# Patient Record
Sex: Female | Born: 1975 | Race: White | Hispanic: No | Marital: Married | Smoking: Never smoker
Health system: Southern US, Community
[De-identification: ages and names within clinical notes are randomized; demographics above are authoritative.]

## PROBLEM LIST (undated history)

## (undated) DIAGNOSIS — D509 Iron deficiency anemia, unspecified: Secondary | ICD-10-CM

## (undated) DIAGNOSIS — R87629 Unspecified abnormal cytological findings in specimens from vagina: Secondary | ICD-10-CM

## (undated) DIAGNOSIS — O365990 Maternal care for other known or suspected poor fetal growth, unspecified trimester, not applicable or unspecified: Secondary | ICD-10-CM

## (undated) DIAGNOSIS — O99019 Anemia complicating pregnancy, unspecified trimester: Secondary | ICD-10-CM

## (undated) DIAGNOSIS — Z789 Other specified health status: Secondary | ICD-10-CM

## (undated) DIAGNOSIS — D62 Acute posthemorrhagic anemia: Secondary | ICD-10-CM

## (undated) HISTORY — PX: LEEP: SHX91

## (undated) HISTORY — DX: Unspecified abnormal cytological findings in specimens from vagina: R87.629

## (undated) HISTORY — PX: NO PAST SURGERIES: SHX2092

## (undated) HISTORY — DX: Maternal care for other known or suspected poor fetal growth, unspecified trimester, not applicable or unspecified: O36.5990

---

## 1998-12-20 ENCOUNTER — Other Ambulatory Visit: Admission: RE | Admit: 1998-12-20 | Discharge: 1998-12-20 | Payer: Self-pay | Admitting: Obstetrics and Gynecology

## 1999-02-03 ENCOUNTER — Other Ambulatory Visit: Admission: RE | Admit: 1999-02-03 | Discharge: 1999-02-03 | Payer: Self-pay | Admitting: Obstetrics and Gynecology

## 1999-03-06 ENCOUNTER — Other Ambulatory Visit: Admission: RE | Admit: 1999-03-06 | Discharge: 1999-03-06 | Payer: Self-pay | Admitting: Obstetrics and Gynecology

## 1999-07-04 ENCOUNTER — Other Ambulatory Visit: Admission: RE | Admit: 1999-07-04 | Discharge: 1999-07-04 | Payer: Self-pay | Admitting: Obstetrics and Gynecology

## 1999-10-27 ENCOUNTER — Other Ambulatory Visit: Admission: RE | Admit: 1999-10-27 | Discharge: 1999-10-27 | Payer: Self-pay | Admitting: Obstetrics and Gynecology

## 2000-03-09 ENCOUNTER — Other Ambulatory Visit: Admission: RE | Admit: 2000-03-09 | Discharge: 2000-03-09 | Payer: Self-pay | Admitting: Obstetrics and Gynecology

## 2010-05-26 LAB — CBC
HCT: 36 % (ref 36–46)
Hemoglobin: 11.4 g/dL — AB (ref 12.0–16.0)
Platelets: 313 K/µL (ref 150–399)

## 2010-05-26 LAB — HIV ANTIBODY (ROUTINE TESTING W REFLEX): HIV: NONREACTIVE

## 2010-05-26 LAB — ANTIBODY SCREEN: Antibody Screen: NEGATIVE

## 2010-05-26 LAB — RPR: RPR: NONREACTIVE

## 2010-05-26 LAB — RUBELLA ANTIBODY, IGM: Rubella: IMMUNE

## 2010-05-26 LAB — HEPATITIS B SURFACE ANTIGEN: Hepatitis B Surface Ag: NEGATIVE

## 2010-12-31 ENCOUNTER — Inpatient Hospital Stay (HOSPITAL_COMMUNITY)
Admission: AD | Admit: 2010-12-31 | Discharge: 2011-01-02 | DRG: 373 | Disposition: A | Discharge status: Home / Self Care | Payer: BC Managed Care – PPO | Source: Ambulatory Visit | Attending: Obstetrics | Admitting: Obstetrics

## 2010-12-31 ENCOUNTER — Inpatient Hospital Stay (HOSPITAL_COMMUNITY): Payer: BC Managed Care – PPO | Admitting: Anesthesiology

## 2010-12-31 ENCOUNTER — Inpatient Hospital Stay (HOSPITAL_COMMUNITY)
Admission: AD | Admit: 2010-12-31 | Discharge: 2010-12-31 | Disposition: A | Discharge status: Home / Self Care | Payer: Self-pay | Attending: Obstetrics | Admitting: Obstetrics

## 2010-12-31 ENCOUNTER — Inpatient Hospital Stay (HOSPITAL_COMMUNITY): Admission: AD | Admit: 2010-12-31 | Payer: Self-pay | Source: Home / Self Care | Admitting: Obstetrics

## 2010-12-31 ENCOUNTER — Encounter (HOSPITAL_COMMUNITY): Payer: Self-pay

## 2010-12-31 ENCOUNTER — Encounter (HOSPITAL_COMMUNITY): Payer: Self-pay | Admitting: Anesthesiology

## 2010-12-31 DIAGNOSIS — D509 Iron deficiency anemia, unspecified: Secondary | ICD-10-CM | POA: Diagnosis present

## 2010-12-31 DIAGNOSIS — O09529 Supervision of elderly multigravida, unspecified trimester: Secondary | ICD-10-CM | POA: Diagnosis present

## 2010-12-31 DIAGNOSIS — O9903 Anemia complicating the puerperium: Secondary | ICD-10-CM | POA: Diagnosis not present

## 2010-12-31 DIAGNOSIS — IMO0002 Reserved for concepts with insufficient information to code with codable children: Secondary | ICD-10-CM | POA: Diagnosis present

## 2010-12-31 DIAGNOSIS — O365990 Maternal care for other known or suspected poor fetal growth, unspecified trimester, not applicable or unspecified: Principal | ICD-10-CM | POA: Diagnosis present

## 2010-12-31 DIAGNOSIS — D62 Acute posthemorrhagic anemia: Secondary | ICD-10-CM | POA: Diagnosis not present

## 2010-12-31 HISTORY — DX: Acute posthemorrhagic anemia: D62

## 2010-12-31 HISTORY — DX: Iron deficiency anemia, unspecified: D50.9

## 2010-12-31 HISTORY — DX: Anemia complicating pregnancy, unspecified trimester: O99.019

## 2010-12-31 HISTORY — DX: Other specified health status: Z78.9

## 2010-12-31 LAB — CBC
HCT: 33.6 % — ABNORMAL LOW (ref 36.0–46.0)
MCHC: 32.4 g/dL (ref 30.0–36.0)
Platelets: 250 10*3/uL (ref 150–400)
RDW: 15.8 % — ABNORMAL HIGH (ref 11.5–15.5)
WBC: 7.7 10*3/uL (ref 4.0–10.5)

## 2010-12-31 LAB — RPR: RPR Ser Ql: NONREACTIVE

## 2010-12-31 MED ORDER — LACTATED RINGERS IV SOLN: 500.0000 mL | INTRAVENOUS | Status: DC | PRN

## 2010-12-31 MED ORDER — EPHEDRINE 5 MG/ML INJ
10.0000 mg | INTRAVENOUS | Status: DC | PRN
  Filled 2010-12-31: qty 4

## 2010-12-31 MED ORDER — TETANUS-DIPHTH-ACELL PERTUSSIS 5-2.5-18.5 LF-MCG/0.5 IM SUSP
0.5000 mL | Freq: Once | INTRAMUSCULAR | Status: AC
  Administered 2011-01-01: 0.5 mL via INTRAMUSCULAR
  Filled 2010-12-31: qty 0.5

## 2010-12-31 MED ORDER — ONDANSETRON HCL 4 MG/2ML IJ SOLN: 4.0000 mg | Freq: Four times a day (QID) | INTRAMUSCULAR | Status: DC | PRN

## 2010-12-31 MED ORDER — IBUPROFEN 600 MG PO TABS
600.0000 mg | ORAL_TABLET | Freq: Four times a day (QID) | ORAL | Status: DC
  Administered 2011-01-01 – 2011-01-02 (×6): 600 mg via ORAL
  Filled 2010-12-31 (×6): qty 1

## 2010-12-31 MED ORDER — SIMETHICONE 80 MG PO CHEW: 80.0000 mg | CHEWABLE_TABLET | ORAL | Status: DC | PRN

## 2010-12-31 MED ORDER — OXYCODONE-ACETAMINOPHEN 5-325 MG PO TABS: 1.0000 | ORAL_TABLET | ORAL | Status: DC | PRN

## 2010-12-31 MED ORDER — OXYTOCIN 20 UNITS IN LACTATED RINGERS INFUSION - SIMPLE
1.0000 m[IU]/min | INTRAVENOUS | Status: DC
Start: 2010-12-31 — End: 2010-12-31
  Administered 2010-12-31: 2 m[IU]/min via INTRAVENOUS

## 2010-12-31 MED ORDER — FLEET ENEMA 7-19 GM/118ML RE ENEM: 1.0000 | ENEMA | RECTAL | Status: DC | PRN

## 2010-12-31 MED ORDER — IBUPROFEN 600 MG PO TABS: 600.0000 mg | ORAL_TABLET | Freq: Four times a day (QID) | ORAL | Status: DC | PRN

## 2010-12-31 MED ORDER — CITRIC ACID-SODIUM CITRATE 334-500 MG/5ML PO SOLN: 30.0000 mL | ORAL | Status: DC | PRN

## 2010-12-31 MED ORDER — ZOLPIDEM TARTRATE 5 MG PO TABS: 5.0000 mg | ORAL_TABLET | Freq: Every evening | ORAL | Status: DC | PRN

## 2010-12-31 MED ORDER — ACETAMINOPHEN 325 MG PO TABS: 650.0000 mg | ORAL_TABLET | ORAL | Status: DC | PRN

## 2010-12-31 MED ORDER — WITCH HAZEL-GLYCERIN EX PADS: 1.0000 "application " | MEDICATED_PAD | CUTANEOUS | Status: DC | PRN

## 2010-12-31 MED ORDER — LANOLIN HYDROUS EX OINT: TOPICAL_OINTMENT | CUTANEOUS | Status: DC | PRN

## 2010-12-31 MED ORDER — LIDOCAINE HCL 1.5 % IJ SOLN
INTRAMUSCULAR | Status: DC | PRN
  Administered 2010-12-31 (×2): 5 mL via EPIDURAL

## 2010-12-31 MED ORDER — PHENYLEPHRINE 40 MCG/ML (10ML) SYRINGE FOR IV PUSH (FOR BLOOD PRESSURE SUPPORT)
80.0000 ug | PREFILLED_SYRINGE | INTRAVENOUS | Status: DC | PRN
  Filled 2010-12-31: qty 5

## 2010-12-31 MED ORDER — OXYCODONE-ACETAMINOPHEN 5-325 MG PO TABS: 2.0000 | ORAL_TABLET | ORAL | Status: DC | PRN

## 2010-12-31 MED ORDER — PRENATAL PLUS 27-1 MG PO TABS
1.0000 | ORAL_TABLET | Freq: Every day | ORAL | Status: DC
  Administered 2011-01-01 – 2011-01-02 (×2): 1 via ORAL
  Filled 2010-12-31 (×2): qty 1

## 2010-12-31 MED ORDER — ONDANSETRON HCL 4 MG PO TABS: 4.0000 mg | ORAL_TABLET | ORAL | Status: DC | PRN

## 2010-12-31 MED ORDER — LACTATED RINGERS IV SOLN
INTRAVENOUS | Status: DC
  Administered 2010-12-31: 125 mL/h via INTRAVENOUS
  Administered 2010-12-31: 18:00:00 via INTRAVENOUS

## 2010-12-31 MED ORDER — ONDANSETRON HCL 4 MG/2ML IJ SOLN: 4.0000 mg | INTRAMUSCULAR | Status: DC | PRN

## 2010-12-31 MED ORDER — EPHEDRINE 5 MG/ML INJ
10.0000 mg | INTRAVENOUS | Status: DC | PRN
  Filled 2010-12-31 (×2): qty 4

## 2010-12-31 MED ORDER — DIBUCAINE 1 % RE OINT: 1.0000 "application " | TOPICAL_OINTMENT | RECTAL | Status: DC | PRN

## 2010-12-31 MED ORDER — SENNOSIDES-DOCUSATE SODIUM 8.6-50 MG PO TABS
2.0000 | ORAL_TABLET | Freq: Every day | ORAL | Status: DC
  Administered 2011-01-01: 2 via ORAL

## 2010-12-31 MED ORDER — FENTANYL 2.5 MCG/ML BUPIVACAINE 1/10 % EPIDURAL INFUSION (WH - ANES)
14.0000 mL/h | INTRAMUSCULAR | Status: DC
  Administered 2010-12-31 (×2): 14 mL/h via EPIDURAL
  Filled 2010-12-31 (×2): qty 60

## 2010-12-31 MED ORDER — DIPHENHYDRAMINE HCL 25 MG PO CAPS: 25.0000 mg | ORAL_CAPSULE | Freq: Four times a day (QID) | ORAL | Status: DC | PRN

## 2010-12-31 MED ORDER — PHENYLEPHRINE 40 MCG/ML (10ML) SYRINGE FOR IV PUSH (FOR BLOOD PRESSURE SUPPORT)
80.0000 ug | PREFILLED_SYRINGE | INTRAVENOUS | Status: DC | PRN
  Filled 2010-12-31 (×2): qty 5

## 2010-12-31 MED ORDER — LACTATED RINGERS IV SOLN
500.0000 mL | Freq: Once | INTRAVENOUS | Status: AC
  Administered 2010-12-31: 1000 mL via INTRAVENOUS

## 2010-12-31 MED ORDER — LIDOCAINE HCL (PF) 1 % IJ SOLN
30.0000 mL | INTRAMUSCULAR | Status: DC | PRN
  Filled 2010-12-31 (×2): qty 30

## 2010-12-31 MED ORDER — OXYTOCIN BOLUS FROM INFUSION
500.0000 mL | Freq: Once | INTRAVENOUS | Status: AC
  Administered 2010-12-31: 500 mL via INTRAVENOUS
  Filled 2010-12-31: qty 1000
  Filled 2010-12-31: qty 500

## 2010-12-31 MED ORDER — DIPHENHYDRAMINE HCL 50 MG/ML IJ SOLN: 12.5000 mg | INTRAMUSCULAR | Status: DC | PRN

## 2010-12-31 MED ORDER — BENZOCAINE-MENTHOL 20-0.5 % EX AERO: 1.0000 "application " | INHALATION_SPRAY | CUTANEOUS | Status: DC | PRN

## 2010-12-31 NOTE — Progress Notes (Signed)
Delivery Note At 7.49 pm a viable female was delivered via  (Presentation: Vertex, OA, ;  ).  APGAR: 7/9 , ; weight .  6 lb 1 oz. Placenta status: complete and intact, spontaneous .  Cord: 3 vessel./  Anesthesia:  Epidural Episiotomy:  none Lacerations: small 1st degree vaginal Suture Repair: 2.0 vicryl Est. Blood Loss (mL): 200 cc  Mom to postpartum.  Baby to nursery-stable. Plans to breast and bottle feed.   Kingstyn Deruiter R 12/31/2010, 8:14 PM

## 2010-12-31 NOTE — ED Notes (Signed)
L&D, Hubert Azure, RN called back for Dr. Seymour Bars. Pt. to stay and await room assignment.

## 2010-12-31 NOTE — Progress Notes (Signed)
  Subjective: Doing well, pain well controlled with Epidural, no pelvic pressure  Objective: BP 113/73  Pulse 92  Temp(Src) 98.4 F (36.9 C) (Oral)  Resp 20  Ht 5\' 1"  (1.549 m)  Wt 55.611 kg (122 lb 9.6 oz)  BMI 23.17 kg/m2  SpO2 100%  FHT:  FHR: 140s bpm, variability: moderate,  accelerations:  Present,  decelerations:  Absent UC:   irregular, every 3-6 minutes (pitocin at 4 mu rate) SVE:   Dilation: 6 Effacement (%): 100 Station: -2 Exam by:: dr. Cassadi Purdie AROM clear fluid.   Assessment / Plan: Augmentation of labor, progressing well  Fetal Wellbeing:  Category I Pain Control:  Epidural  Anticipated MOD:  NSVD  Tracy Crosby R 12/31/2010, 5:28 PM

## 2010-12-31 NOTE — Progress Notes (Signed)
Pt states she was in the office on 8-1 and was 5 cm. Was told to come to [facility] today for direct admit for induction of labor. Pt states irregular contractions, good fetal movement, no bleeding or leaking.

## 2010-12-31 NOTE — Anesthesia Procedure Notes (Signed)

## 2010-12-31 NOTE — ED Notes (Signed)
Charge nurse on BS to call back with estimated time for patient to go to BS. If it is going to be a while yet, Dr. Seymour Bars states pt. may go home and be called when room available. Will await CN call.

## 2010-12-31 NOTE — Anesthesia Preprocedure Evaluation (Signed)
Anesthesia Evaluation  Name, MR# and DOB Patient awake  General Assessment Comment  Reviewed: Allergy & Precautions, H&P , Patient's Chart, lab work & pertinent test results  Airway Mallampati: II TM Distance: >3 FB Neck ROM: full    Dental No notable dental hx.    Pulmonary  clear to auscultation  pulmonary exam normalPulmonary Exam Normal breath sounds clear to auscultation none    Cardiovascular regular Normal    Neuro/Psych Negative Neurological ROS  Negative Psych ROS  GI/Hepatic/Renal negative GI ROS, negative Liver ROS, and negative Renal ROS (+)       Endo/Other  Negative Endocrine ROS (+)      Abdominal   Musculoskeletal   Hematology negative hematology ROS (+)   Peds  Reproductive/Obstetrics (+) Pregnancy    Anesthesia Other Findings             Anesthesia Physical Anesthesia Plan  ASA: II  Anesthesia Plan: Epidural   Post-op Pain Management:    Induction:   Airway Management Planned:   Additional Equipment:   Intra-op Plan:   Post-operative Plan:   Informed Consent: I have reviewed the patients History and Physical, chart, labs and discussed the procedure including the risks, benefits and alternatives for the proposed anesthesia with the patient or authorized representative who has indicated his/her understanding and acceptance.     Plan Discussed with:   Anesthesia Plan Comments:         Anesthesia Quick Evaluation  

## 2010-12-31 NOTE — H&P (Signed)
  Tracy Crosby is a 34 y.o. female presenting for IOL at 40 wks for IUGR, sono on 8/13 noted growth at 6% EFW 6'1" . Desires epidural, cx is 5 cm dilated per Dr Ernestina Penna in office on 8/13.  [facility] since 8 wks   OB History    Grav Para Term Preterm Abortions TAB SAB Ect Mult Living   4 2 2  0 1 0 1 0 0 2     Past Medical History  Diagnosis Date  . No pertinent past medical history    Past Surgical History  Procedure Date  . No past surgeries    Family History: family history includes Asthma in her brother and Birth defects in her brother. Social History:  reports that she has never smoked. She has never used smokeless tobacco. She reports that she does not drink alcohol or use illicit drugs.  PHYSICAL EXAM:   Blood pressure 102/61, pulse 83, temperature 98.4 F (36.9 C), temperature source Oral, resp. rate 20, height 5\' 1"  (1.549 m), weight 55.611 kg (122 lb 9.6 oz), SpO2 99.00%.  A&O x 3, no acute distress. Pleasant HEENT neg, no thyromegaly Lungs CTA bilat CV RRR, A1S2 normal Abdo soft, non tender, non acute Extr no edema/ tenderness Pelvic 5/80%/-1/ intact/ vtx FHT 130/ + accels/ no decels/ mod variability/ Cat.I Toco irreg.   Prenatal labs: ABO, Rh: A/Positive/-- (01/09 0000) Antibody: Negative (01/09 0000) Rubella:  Immune RPR: Nonreactive (01/09 0000)  HBsAg: Negative (01/09 0000)  HIV: Non-reactive (01/09 0000)  GBS: Negative (07/17 0000)  1 hr Glucola  130 Genetic screening  Quad screen negative Anatomy US Normal (partial previa resolved), recent sono - IUGR (on 12/29/10), EFW at 6%.   Assessment/Plan: Admit for AOL at 40 wks for IUGR and advanced cervical dilatation/prodromal labor Low dose pitocin, epidural, AROM and anticipate SVD.   Tracy Crosby R 12/31/2010, 2:12 PM

## 2010-12-31 NOTE — Progress Notes (Signed)
Pt. States was told fetal growth is 10th%ile, the reason for having pt. come in today for induction.

## 2010-12-31 NOTE — Progress Notes (Signed)
  Subjective: Doing well, pain well controlled with  Epidural, no pelvic pressure.  Objective: BP 117/73  Pulse 91  Temp(Src) 98.3 F (36.8 C) (Oral)  Resp 20  Ht 5\' 1"  (1.549 m)  Wt 55.611 kg (122 lb 9.6 oz)  BMI 23.17 kg/m2  SpO2 100%  FHT:  FHR: 130s bpm, variability: moderate,  accelerations:  Present,  decelerations:  Absent UC:   regular, every 2 minutes (on pitocin at 4 mu rate, will reduce it to 2 mu rate). SVE:  Complete/ 100%/+1.   Assessment / Plan: Augmentation of labor, progressing well.   Fetal Wellbeing:  Category I Pain Control:  Epidural  Anticipated MOD:  NSVD. Will begin bearing down once her husband returns to room since [employer] [employer] [facility] [employer] category I and mom is comfortable.   Rmoni Keplinger R 12/31/2010, 7:03 PM

## 2011-01-01 ENCOUNTER — Encounter (HOSPITAL_COMMUNITY): Payer: Self-pay

## 2011-01-01 ENCOUNTER — Other Ambulatory Visit: Payer: Self-pay | Admitting: Obstetrics & Gynecology

## 2011-01-01 DIAGNOSIS — D509 Iron deficiency anemia, unspecified: Secondary | ICD-10-CM | POA: Diagnosis present

## 2011-01-01 DIAGNOSIS — D62 Acute posthemorrhagic anemia: Secondary | ICD-10-CM | POA: Diagnosis not present

## 2011-01-01 HISTORY — DX: Iron deficiency anemia, unspecified: D50.9

## 2011-01-01 HISTORY — DX: Acute posthemorrhagic anemia: D62

## 2011-01-01 LAB — CBC
HCT: 28.3 % — ABNORMAL LOW (ref 36.0–46.0)
Hemoglobin: 9.2 g/dL — ABNORMAL LOW (ref 12.0–15.0)
MCHC: 32.5 g/dL (ref 30.0–36.0)
MCV: 84.2 fL (ref 78.0–100.0)
RDW: 15.8 % — ABNORMAL HIGH (ref 11.5–15.5)

## 2011-01-01 MED ORDER — POLYSACCHARIDE IRON 150 MG PO CAPS
150.0000 mg | ORAL_CAPSULE | Freq: Every day | ORAL | Status: DC
  Administered 2011-01-01: 150 mg via ORAL
  Filled 2011-01-01 (×2): qty 1

## 2011-01-01 MED ORDER — DOCUSATE SODIUM 100 MG PO CAPS
100.0000 mg | ORAL_CAPSULE | Freq: Every day | ORAL | Status: DC
  Administered 2011-01-01: 100 mg via ORAL
  Filled 2011-01-01: qty 1

## 2011-01-01 NOTE — Progress Notes (Signed)
  PPD 1 SVD  S:  Reports feeling well but tired, baby up feeding through the night             Tolerating po/ No nausea or vomiting             Bleeding is moderate             Pain controlled withprescription NSAID's including ibuprofen (Motrin)             Up ad lib / ambulatory  Newborn breast feeding    O:  A & O x 3 NAD             VS: Blood pressure 119/82, pulse 103, temperature 98.3 F (36.8 C), temperature source Oral, resp. rate 20, height 5\' 1"  (1.549 m), weight 55.611 kg (122 lb 9.6 oz), SpO2 96.00%,    LABS:  Basename 01/01/11 0510 12/31/10 1400  HGB 9.2* 10.9*  HCT 28.3* 33.6*    I&O: I/O last 3 completed shifts: In: -  Out: 350 [Urine:150; Blood:200]      Lungs: Clear and unlabored  Heart: regular rate and rhythm / no mumurs  Abdomen: soft, non-tender, non-distended              Fundus: firm, non-tender, U@  Perineum: intact repair, no edema  Lochia: small  Extremities: no edema, no calf pain or tenderness, neg Homans    A/P: PPD # 1   Doing well - stable status  Chronic IDA w/ superimposed ABL - will start oral Fe and stool softeners  Routine post partum orders  Anticipate d/c in am  PAUL,DANIELA 01/01/2011, 12:44 PM

## 2011-01-02 MED ORDER — DSS 100 MG PO CAPS: 100.0000 mg | ORAL_CAPSULE | Freq: Every day | ORAL | Status: AC

## 2011-01-02 NOTE — Anesthesia Postprocedure Evaluation (Signed)
  Anesthesia Post-op Note  Patient: Tracy Crosby  Procedure(s) Performed: * No procedures listed *  Patient Location: PACU and Mother/Baby  Anesthesia Type: Epidural  Level of Consciousness: awake, alert  and oriented  Airway and Oxygen Therapy: Patient Spontanous Breathing  Post-op Pain: mild  Post-op Assessment: Patient's Cardiovascular Status Stable  Post-op Vital Signs: stable  Complications: No apparent anesthesia complications

## 2011-01-02 NOTE — Discharge Summary (Signed)
Obstetric Discharge Summary Reason for Admission: onset of labor/IUGR Prenatal Procedures: ultrasound Intrapartum Procedures: spontaneous vaginal delivery Postpartum Procedures: none Complications-Operative and Postpartum: Small 1st degree perineal laceration Hemoglobin  Date Value Range Status  01/01/2011 9.2* 12.0-15.0 (g/dL) Final     HCT  Date Value Range Status  01/01/2011 28.3* 36.0-46.0 (%) Final    Discharge Diagnoses: Term Pregnancy-delivered and ABL Anemia  Discharge Information: Date: 01/02/2011 Activity: pelvic rest Diet: routine Medications: Colace and Iron Condition: stable Instructions: refer to practice specific booklet Discharge to: home   Newborn Data: Live born female  Birth Weight: 6 lb 1.5 oz (2765 g) APGAR: 7, 9  Home with mother.  Libby Goehring K 01/02/2011, 10:11 AM

## 2011-01-02 NOTE — Progress Notes (Signed)
  PPD # 2  Subjective: Pt reports feeling well and eager for d/c home/ Pain controlled with ibuprofen (OTC) Tolerating po/ Voiding without problems/ No n/v Bleeding is light/ Newborn info:  Information for the patient's newborn:  Bame, Girl Der [[id number]]  female Feeding: breast    Objective:  VS: Blood pressure 101/67, pulse 69, temperature 97.9 F (36.6 C), temperature source Oral, resp. rate 18  Basename 01/01/11 0510 12/31/10 1400  WBC 9.3 7.7  HGB 9.2* 10.9*  HCT 28.3* 33.6*  PLT 209 250    Blood type: A/Positive/-- (01/09 0000) Rubella: Immune (01/09 0000)    Physical Exam:  General: alert, cooperative and no distress Abdomen: soft, nontender, normal bowel sounds Uterine Fundus: firm, below umbilicus, nontender Perineum: not inspected Lochia: minimal Ext: Homans sign is negative, no sign of DVT and no edema, redness or tenderness in the calves or thighs   A/P: PPD # 2/ [id number] Chronic Anemia, superimposed with ABL Anemia Doing well and stable for discharge home RX: niferex 156m po QD #30 Ref x 1 Colace 100mg  po 1 to 2 times per day prn #30 Ref x 1 WOB/GYN booklet given Routine pp visit in 6wks

## 2011-01-03 NOTE — Discharge Summary (Signed)
Agree with note and plan.

## 2014-03-19 ENCOUNTER — Encounter (HOSPITAL_COMMUNITY): Payer: Self-pay

## 2016-01-17 DIAGNOSIS — Z3A01 Less than 8 weeks gestation of pregnancy: Secondary | ICD-10-CM | POA: Diagnosis not present

## 2016-01-17 DIAGNOSIS — O2620 Pregnancy care for patient with recurrent pregnancy loss, unspecified trimester: Secondary | ICD-10-CM | POA: Diagnosis not present

## 2016-01-17 DIAGNOSIS — Z3201 Encounter for pregnancy test, result positive: Secondary | ICD-10-CM | POA: Diagnosis not present

## 2016-01-29 DIAGNOSIS — Z3201 Encounter for pregnancy test, result positive: Secondary | ICD-10-CM | POA: Diagnosis not present

## 2016-02-17 ENCOUNTER — Other Ambulatory Visit: Payer: Self-pay

## 2016-02-17 DIAGNOSIS — Z3A11 11 weeks gestation of pregnancy: Secondary | ICD-10-CM | POA: Diagnosis not present

## 2016-02-17 DIAGNOSIS — O09521 Supervision of elderly multigravida, first trimester: Secondary | ICD-10-CM | POA: Diagnosis not present

## 2016-02-17 DIAGNOSIS — Z1329 Encounter for screening for other suspected endocrine disorder: Secondary | ICD-10-CM | POA: Diagnosis not present

## 2016-02-17 LAB — OB RESULTS CONSOLE ANTIBODY SCREEN: Antibody Screen: NEGATIVE

## 2016-02-17 LAB — OB RESULTS CONSOLE HEPATITIS B SURFACE ANTIGEN: HEP B S AG: NEGATIVE

## 2016-02-17 LAB — OB RESULTS CONSOLE ABO/RH: RH TYPE: POSITIVE

## 2016-02-17 LAB — OB RESULTS CONSOLE HIV ANTIBODY (ROUTINE TESTING): HIV: NONREACTIVE

## 2016-02-17 LAB — OB RESULTS CONSOLE RUBELLA ANTIBODY, IGM: RUBELLA: IMMUNE

## 2016-02-17 LAB — OB RESULTS CONSOLE RPR: RPR: NONREACTIVE

## 2016-03-09 DIAGNOSIS — Z23 Encounter for immunization: Secondary | ICD-10-CM | POA: Diagnosis not present

## 2016-03-25 DIAGNOSIS — O2620 Pregnancy care for patient with recurrent pregnancy loss, unspecified trimester: Secondary | ICD-10-CM | POA: Diagnosis not present

## 2016-03-25 DIAGNOSIS — O09522 Supervision of elderly multigravida, second trimester: Secondary | ICD-10-CM | POA: Diagnosis not present

## 2016-03-25 DIAGNOSIS — Z3A16 16 weeks gestation of pregnancy: Secondary | ICD-10-CM | POA: Diagnosis not present

## 2016-03-25 DIAGNOSIS — Z361 Encounter for antenatal screening for raised alphafetoprotein level: Secondary | ICD-10-CM | POA: Diagnosis not present

## 2016-03-25 DIAGNOSIS — O3442 Maternal care for other abnormalities of cervix, second trimester: Secondary | ICD-10-CM | POA: Diagnosis not present

## 2016-04-01 DIAGNOSIS — Z361 Encounter for antenatal screening for raised alphafetoprotein level: Secondary | ICD-10-CM | POA: Diagnosis not present

## 2016-04-07 ENCOUNTER — Other Ambulatory Visit (HOSPITAL_COMMUNITY): Payer: Self-pay | Admitting: Obstetrics

## 2016-04-07 DIAGNOSIS — Z3A19 19 weeks gestation of pregnancy: Secondary | ICD-10-CM

## 2016-04-07 DIAGNOSIS — O280 Abnormal hematological finding on antenatal screening of mother: Secondary | ICD-10-CM

## 2016-04-07 DIAGNOSIS — Z3689 Encounter for other specified antenatal screening: Secondary | ICD-10-CM

## 2016-04-07 DIAGNOSIS — O09522 Supervision of elderly multigravida, second trimester: Secondary | ICD-10-CM

## 2016-04-15 ENCOUNTER — Encounter (HOSPITAL_COMMUNITY): Payer: Self-pay | Admitting: *Deleted

## 2016-04-16 ENCOUNTER — Ambulatory Visit (HOSPITAL_COMMUNITY)
Admission: RE | Admit: 2016-04-16 | Discharge: 2016-04-16 | Disposition: A | Discharge status: Home / Self Care | Payer: BLUE CROSS/BLUE SHIELD | Source: Ambulatory Visit | Attending: Obstetrics | Admitting: Obstetrics

## 2016-04-16 ENCOUNTER — Encounter (HOSPITAL_COMMUNITY): Payer: Self-pay

## 2016-04-16 DIAGNOSIS — O289 Unspecified abnormal findings on antenatal screening of mother: Secondary | ICD-10-CM | POA: Insufficient documentation

## 2016-04-16 DIAGNOSIS — Z3A19 19 weeks gestation of pregnancy: Secondary | ICD-10-CM

## 2016-04-16 DIAGNOSIS — O09522 Supervision of elderly multigravida, second trimester: Secondary | ICD-10-CM | POA: Diagnosis not present

## 2016-04-16 DIAGNOSIS — O280 Abnormal hematological finding on antenatal screening of mother: Secondary | ICD-10-CM

## 2016-04-16 DIAGNOSIS — Z3689 Encounter for other specified antenatal screening: Secondary | ICD-10-CM | POA: Diagnosis not present

## 2016-04-16 DIAGNOSIS — Z363 Encounter for antenatal screening for malformations: Secondary | ICD-10-CM | POA: Diagnosis not present

## 2016-04-16 NOTE — Progress Notes (Signed)
Genetic Counseling  High-Risk Gestation Note  Appointment Date:  04/16/2016 Referred By: Aloha Gell, MD Date of Birth:  Sep 14, 1975   Pregnancy History: [id number] Estimated Date of Delivery: 09/07/16 Estimated Gestational Age: 60w3dAttending:  PBenjaman Lobe MD   [name] was seen for consultation for genetic counseling because of an elevated MSAFP of 3.35 MoMs based on maternal serum screening. The patient is 40y.o..    In summary:  Discussed elevated MSAFP and associated 1 in [id number]  Reviewed possible explanations for elevation  Discussed additional options  Ultrasound- performed today, within normal range  Amniocentesis-declined  Discussed associations with unexplained elevated MSAFP- follow-up ultrasound available in third trimester to assess fetal growth  Reviewed AMA and associated risk for fetal aneuploidy  Reviewed results of previous screening  NIPS (Panorama) performed through OB office- within normal range  Reviewed family history concerns  Discussed carrier screening options  CF- previously performed and screen negative per patient  SMA- declined  Hemoglobinopathies- declined  We reviewed [name]'s maternal serum screening result, the elevation of MSAFP, and the associated 1 in 65 risk for a fetal open neural tube defect.   We reviewed open neural tube defects including: the typical multifactorial etiology and variable prognosis.  In addition, we discussed alternative explanations for an elevated MSAFP including: normal variation, twins, feto-maternal bleeding, a gestational dating error, abdominal wall defects, kidney differences, oligohydramnios, and placental problems.  We discussed that an unexplained elevation of MSAFP is associated with an increased risk for third trimester complications including: prematurity, low birth weight, and pre-eclampsia.    We reviewed additional available screening and diagnostic options including  detailed ultrasound and amniocentesis.  We discussed the risks, limitations, and benefits of each. A complete ultrasound was performed today, and visualized fetal anatomy was within normal range. Completed ultrasound results reported under separate cover.  After thoughtful consideration of these options, [name] declined amniocentesis. She understands that ultrasound cannot rule out all birth defects or genetic syndromes.  However, she was counseled that 80-90% of fetuses with open neural tube defects can be detected by detailed second trimester ultrasound, when well visualized. Given the elevated MSAFP a third trimester growth scan is available to the patient to assess fetal growth.    We also reviewed the association with fetal aneuploidy related to increased maternal age. We reviewed chromosomes, nondisjunction, and examples of chromosome conditions. We also reviewed [name]'s noninvasive prenatal screening (NIPS)/cell free DNA (cfDNA) testing result. Specifically, Mrs. RBERGEVINhad Panorama testing performed at her obstetrician's office. We reviewed that the results from this testing are within normal range, showing less than 1 in 10,000 risk for trisomy 21, 18, 13, and monosomy X. We reviewed that NIPS analyzes specific placental (fetal) DNA in maternal circulation. NIPS is considered to be highly specific and sensitive, but is not considered to be a diagnostic test. We reviewed that this testing identifies the majority of pregnancies with trisomies 21, 12 and 18, as well as specific sex chromosome conditions including: 47,XXX and 47,XXY.     We reviewed other available options including detailed ultrasound and amniocentesis. We reviewed the approximate 1 in 3063-016risk for complications for amniocentesis, including spontaneous pregnancy loss. Ms. RBALBIunderstands that given the normal NIPS (Panorama) result, the risk of fetal aneuploidy is much lower than the risk of a complication related to  diagnostic invasive testing. After consideration of these options, she declined amniocentesis.  Mrs. RGAINERwas provided with written information regarding cystic fibrosis (  CF), spinal muscular atrophy (SMA) and hemoglobinopathies including the carrier frequency, availability of carrier screening and prenatal diagnosis if indicated.  In addition, we discussed that CF and hemoglobinopathies are routinely screened for as part of the [facility] newborn screening panel.  She declined screening for CF, SMA and hemoglobinopathies.  Both family histories were reviewed and found to be contributory for congenital heart disease for the patient's brother. He reportedly had two holes in his heart at birth and had corrective surgery. He also has a history of asthma and is currently 42 years old in otherwise good health. No specific etiology was known for his congenital heart disease. Congenital heart defects occur in approximately 1% of pregnancies.  Congenital heart defects may occur due to multifactorial influences, chromosomal abnormalities, genetic syndromes or environmental exposures.  Isolated heart defects are generally multifactorial.  Given the reported family history and assuming multifactorial inheritance, the risk for a congenital heart defect in the current pregnancy, a second degree relative to the pregnancy, would be approximately 1-2%. Without further information regarding the provided family history, an accurate genetic risk cannot be calculated.   [name] also reported two female paternal first cousins (her paternal uncle's identical twins) with intellectual disability. They were not described to have dysmorphic features. No additional information was known regarding a specific etiology. [name] was counseled that there are many different causes of intellectual disabilities including environmental, multifactorial, and genetic etiologies.  We discussed that a specific diagnosis for intellectual disability can be  determined in approximately 50% of these individuals.  In the remaining 50% of individuals, a diagnosis may never be determined.  Regarding genetic causes, we discussed that chromosome aberrations (aneuploidy, deletions, duplications, insertions, and translocations) are responsible for a small percentage of individuals with intellectual disability.  Many individuals with chromosome aberrations have additional differences, including congenital anomalies or minor dysmorphisms.  Likewise, single gene conditions are the underlying cause of intellectual delay in some families.  We discussed that many gene conditions have intellectual disability as a feature, but also often include other physical or medical differences. We discussed that without more specific information, it is difficult to provide an accurate risk assessment; however, given the reported family history and degree of relation, recurrence risk for the current pregnancy is likely low.  Further genetic counseling is warranted if more information is obtained. Further genetic counseling is warranted if more information is obtained.  [name] denied exposure to environmental toxins or chemical agents. She denied the use of alcohol, tobacco or street drugs. She denied significant viral illnesses during the course of her pregnancy. 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 No specific etiology is known. Approximately 1 in 6 confirmed pregnancies results in miscarriage. A single underlying cause is more likely to be suspected when a couple has experienced 3 or more losses. It is less likely that there will be an identifiable single underlying cause when a couple has experienced less than 3 losses. Several possible causes include sporadic fetal aneuploidy related to maternal age, chromosome rearrangements, antibodies, and thrombophilia. In approximately  3-8% of couples with recurrent pregnancy loss, one partner carries a chromosome variant, such as a balanced translocation. Being a carrier of a chromosome variant can increase the risk for abnormalities in the sperm or egg cell, which can increase the risk for miscarriage or the birth of a child with birth defects and/or intellectual disability. Please  contact our office if the patient desires additional work for history of recurrent miscarriage.    I counseled [name] for approximately 30 minutes regarding the above risks and available options.    Chipper Oman, MS,  Certified Genetic Counselor 04/16/2016

## 2016-04-17 ENCOUNTER — Other Ambulatory Visit (HOSPITAL_COMMUNITY): Payer: Self-pay

## 2016-05-18 NOTE — L&D Delivery Note (Signed)
Delivery Note After one controlled push, at 3:28 PM a viable and healthy female was delivered via Vaginal, Spontaneous Delivery (Presentation: OA ).  APGAR: pending, weight  1500 gms Placenta status: spontaneous and complete.  Cord:  with the following complications: Nuchal x2  Cord pH: N/A  Anesthesia:  Epidural  Episiotomy: None Lacerations: None Est. Blood Loss (mL):  200 cc  Mom to postpartum.  Baby to NICU since SGA  Tracy Crosby R 08/18/2016, 3:43 PM

## 2016-05-20 DIAGNOSIS — Z3A24 24 weeks gestation of pregnancy: Secondary | ICD-10-CM | POA: Diagnosis not present

## 2016-05-20 DIAGNOSIS — O3442 Maternal care for other abnormalities of cervix, second trimester: Secondary | ICD-10-CM | POA: Diagnosis not present

## 2016-06-10 DIAGNOSIS — Z3689 Encounter for other specified antenatal screening: Secondary | ICD-10-CM | POA: Diagnosis not present

## 2016-06-10 DIAGNOSIS — Z3A27 27 weeks gestation of pregnancy: Secondary | ICD-10-CM | POA: Diagnosis not present

## 2016-06-18 DIAGNOSIS — Z3A28 28 weeks gestation of pregnancy: Secondary | ICD-10-CM | POA: Diagnosis not present

## 2016-07-01 DIAGNOSIS — Z3A30 30 weeks gestation of pregnancy: Secondary | ICD-10-CM | POA: Diagnosis not present

## 2016-07-01 DIAGNOSIS — O365990 Maternal care for other known or suspected poor fetal growth, unspecified trimester, not applicable or unspecified: Secondary | ICD-10-CM | POA: Diagnosis not present

## 2016-07-01 DIAGNOSIS — Z3689 Encounter for other specified antenatal screening: Secondary | ICD-10-CM | POA: Diagnosis not present

## 2016-07-02 DIAGNOSIS — Z3A30 30 weeks gestation of pregnancy: Secondary | ICD-10-CM | POA: Diagnosis not present

## 2016-07-03 ENCOUNTER — Other Ambulatory Visit (HOSPITAL_COMMUNITY): Payer: Self-pay | Admitting: Obstetrics

## 2016-07-03 DIAGNOSIS — O365930 Maternal care for other known or suspected poor fetal growth, third trimester, not applicable or unspecified: Secondary | ICD-10-CM

## 2016-07-03 DIAGNOSIS — O09523 Supervision of elderly multigravida, third trimester: Secondary | ICD-10-CM

## 2016-07-07 ENCOUNTER — Ambulatory Visit (HOSPITAL_COMMUNITY)
Admission: RE | Admit: 2016-07-07 | Discharge: 2016-07-07 | Disposition: A | Discharge status: Home / Self Care | Payer: BLUE CROSS/BLUE SHIELD | Source: Ambulatory Visit | Attending: Obstetrics | Admitting: Obstetrics

## 2016-07-07 ENCOUNTER — Other Ambulatory Visit (HOSPITAL_COMMUNITY): Payer: Self-pay | Admitting: Obstetrics

## 2016-07-07 ENCOUNTER — Encounter (HOSPITAL_COMMUNITY): Payer: Self-pay

## 2016-07-07 DIAGNOSIS — O283 Abnormal ultrasonic finding on antenatal screening of mother: Secondary | ICD-10-CM | POA: Insufficient documentation

## 2016-07-07 DIAGNOSIS — O280 Abnormal hematological finding on antenatal screening of mother: Secondary | ICD-10-CM | POA: Diagnosis not present

## 2016-07-07 DIAGNOSIS — O365930 Maternal care for other known or suspected poor fetal growth, third trimester, not applicable or unspecified: Secondary | ICD-10-CM

## 2016-07-07 DIAGNOSIS — Z362 Encounter for other antenatal screening follow-up: Secondary | ICD-10-CM | POA: Insufficient documentation

## 2016-07-07 DIAGNOSIS — Z3A31 31 weeks gestation of pregnancy: Secondary | ICD-10-CM

## 2016-07-07 DIAGNOSIS — O09523 Supervision of elderly multigravida, third trimester: Secondary | ICD-10-CM | POA: Diagnosis not present

## 2016-07-08 DIAGNOSIS — O365930 Maternal care for other known or suspected poor fetal growth, third trimester, not applicable or unspecified: Secondary | ICD-10-CM | POA: Diagnosis not present

## 2016-07-08 DIAGNOSIS — Z3A31 31 weeks gestation of pregnancy: Secondary | ICD-10-CM | POA: Diagnosis not present

## 2016-07-13 DIAGNOSIS — Z3A32 32 weeks gestation of pregnancy: Secondary | ICD-10-CM | POA: Diagnosis not present

## 2016-07-13 DIAGNOSIS — O365930 Maternal care for other known or suspected poor fetal growth, third trimester, not applicable or unspecified: Secondary | ICD-10-CM | POA: Diagnosis not present

## 2016-07-16 DIAGNOSIS — O365930 Maternal care for other known or suspected poor fetal growth, third trimester, not applicable or unspecified: Secondary | ICD-10-CM | POA: Diagnosis not present

## 2016-07-16 DIAGNOSIS — O3443 Maternal care for other abnormalities of cervix, third trimester: Secondary | ICD-10-CM | POA: Diagnosis not present

## 2016-07-16 DIAGNOSIS — Z3A32 32 weeks gestation of pregnancy: Secondary | ICD-10-CM | POA: Diagnosis not present

## 2016-07-16 DIAGNOSIS — O365990 Maternal care for other known or suspected poor fetal growth, unspecified trimester, not applicable or unspecified: Secondary | ICD-10-CM | POA: Diagnosis not present

## 2016-07-20 DIAGNOSIS — O365930 Maternal care for other known or suspected poor fetal growth, third trimester, not applicable or unspecified: Secondary | ICD-10-CM | POA: Diagnosis not present

## 2016-07-20 DIAGNOSIS — Z3A33 33 weeks gestation of pregnancy: Secondary | ICD-10-CM | POA: Diagnosis not present

## 2016-07-24 DIAGNOSIS — O365990 Maternal care for other known or suspected poor fetal growth, unspecified trimester, not applicable or unspecified: Secondary | ICD-10-CM | POA: Diagnosis not present

## 2016-07-24 DIAGNOSIS — O365930 Maternal care for other known or suspected poor fetal growth, third trimester, not applicable or unspecified: Secondary | ICD-10-CM | POA: Diagnosis not present

## 2016-07-24 DIAGNOSIS — Z3A33 33 weeks gestation of pregnancy: Secondary | ICD-10-CM | POA: Diagnosis not present

## 2016-07-24 DIAGNOSIS — O3443 Maternal care for other abnormalities of cervix, third trimester: Secondary | ICD-10-CM | POA: Diagnosis not present

## 2016-07-28 DIAGNOSIS — O365930 Maternal care for other known or suspected poor fetal growth, third trimester, not applicable or unspecified: Secondary | ICD-10-CM | POA: Diagnosis not present

## 2016-07-28 DIAGNOSIS — Z3A34 34 weeks gestation of pregnancy: Secondary | ICD-10-CM | POA: Diagnosis not present

## 2016-07-31 DIAGNOSIS — Z3A34 34 weeks gestation of pregnancy: Secondary | ICD-10-CM | POA: Diagnosis not present

## 2016-07-31 DIAGNOSIS — O365930 Maternal care for other known or suspected poor fetal growth, third trimester, not applicable or unspecified: Secondary | ICD-10-CM | POA: Diagnosis not present

## 2016-07-31 DIAGNOSIS — O365990 Maternal care for other known or suspected poor fetal growth, unspecified trimester, not applicable or unspecified: Secondary | ICD-10-CM | POA: Diagnosis not present

## 2016-08-04 DIAGNOSIS — O365990 Maternal care for other known or suspected poor fetal growth, unspecified trimester, not applicable or unspecified: Secondary | ICD-10-CM | POA: Diagnosis not present

## 2016-08-04 DIAGNOSIS — Z3A35 35 weeks gestation of pregnancy: Secondary | ICD-10-CM | POA: Diagnosis not present

## 2016-08-04 DIAGNOSIS — O3443 Maternal care for other abnormalities of cervix, third trimester: Secondary | ICD-10-CM | POA: Diagnosis not present

## 2016-08-04 DIAGNOSIS — O365930 Maternal care for other known or suspected poor fetal growth, third trimester, not applicable or unspecified: Secondary | ICD-10-CM | POA: Diagnosis not present

## 2016-08-04 DIAGNOSIS — Z3685 Encounter for antenatal screening for Streptococcus B: Secondary | ICD-10-CM | POA: Diagnosis not present

## 2016-08-04 LAB — OB RESULTS CONSOLE GBS: GBS: NEGATIVE

## 2016-08-07 DIAGNOSIS — O365930 Maternal care for other known or suspected poor fetal growth, third trimester, not applicable or unspecified: Secondary | ICD-10-CM | POA: Diagnosis not present

## 2016-08-07 DIAGNOSIS — Z3A35 35 weeks gestation of pregnancy: Secondary | ICD-10-CM | POA: Diagnosis not present

## 2016-08-10 DIAGNOSIS — O365930 Maternal care for other known or suspected poor fetal growth, third trimester, not applicable or unspecified: Secondary | ICD-10-CM | POA: Diagnosis not present

## 2016-08-10 DIAGNOSIS — Z3A36 36 weeks gestation of pregnancy: Secondary | ICD-10-CM | POA: Diagnosis not present

## 2016-08-12 ENCOUNTER — Other Ambulatory Visit: Payer: Self-pay | Admitting: Obstetrics

## 2016-08-13 ENCOUNTER — Encounter (HOSPITAL_COMMUNITY): Payer: Self-pay

## 2016-08-13 ENCOUNTER — Other Ambulatory Visit (HOSPITAL_COMMUNITY): Payer: Self-pay | Admitting: Obstetrics

## 2016-08-13 ENCOUNTER — Ambulatory Visit (HOSPITAL_COMMUNITY)
Admission: RE | Admit: 2016-08-13 | Discharge: 2016-08-13 | Disposition: A | Discharge status: Home / Self Care | Payer: BLUE CROSS/BLUE SHIELD | Source: Ambulatory Visit | Attending: Obstetrics | Admitting: Obstetrics

## 2016-08-13 VITALS — BP 120/63 | HR 97 | Wt 128.0 lb

## 2016-08-13 DIAGNOSIS — O280 Abnormal hematological finding on antenatal screening of mother: Secondary | ICD-10-CM

## 2016-08-13 DIAGNOSIS — Z3A36 36 weeks gestation of pregnancy: Secondary | ICD-10-CM

## 2016-08-13 DIAGNOSIS — O365930 Maternal care for other known or suspected poor fetal growth, third trimester, not applicable or unspecified: Secondary | ICD-10-CM | POA: Diagnosis not present

## 2016-08-13 DIAGNOSIS — O4103X0 Oligohydramnios, third trimester, not applicable or unspecified: Secondary | ICD-10-CM | POA: Insufficient documentation

## 2016-08-13 DIAGNOSIS — O4100X0 Oligohydramnios, unspecified trimester, not applicable or unspecified: Secondary | ICD-10-CM

## 2016-08-13 DIAGNOSIS — O09523 Supervision of elderly multigravida, third trimester: Secondary | ICD-10-CM | POA: Insufficient documentation

## 2016-08-13 DIAGNOSIS — O283 Abnormal ultrasonic finding on antenatal screening of mother: Secondary | ICD-10-CM | POA: Diagnosis not present

## 2016-08-13 DIAGNOSIS — O4103X1 Oligohydramnios, third trimester, fetus 1: Secondary | ICD-10-CM

## 2016-08-13 NOTE — Procedures (Signed)
Tracy Crosby [birth year] [redacted]w[redacted]d Fetus A Non-Stress Test Interpretation for 08/13/16  Indication: Unsatisfactory BPP  Fetal Heart Rate A Mode: External Baseline Rate (A): 120 bpm Variability: Moderate Accelerations: 10 x 10, 15 x 15 Decelerations: None Multiple birth?: No  Uterine Activity Mode: Palpation, Toco Contraction Frequency (min): x1 Contraction Duration (sec): 60 Contraction Quality: Mild Resting Tone Palpated: Relaxed Resting Time: Adequate  Interpretation (Fetal Testing) Nonstress Test Interpretation: Reactive Overall Impression: Reassuring for gestational age Comments: Reviewed tracing with Dr. NLucia Gaskins

## 2016-08-17 ENCOUNTER — Encounter (HOSPITAL_COMMUNITY): Payer: Self-pay | Admitting: *Deleted

## 2016-08-17 ENCOUNTER — Telehealth (HOSPITAL_COMMUNITY): Payer: Self-pay | Admitting: *Deleted

## 2016-08-17 ENCOUNTER — Other Ambulatory Visit: Payer: Self-pay | Admitting: Obstetrics & Gynecology

## 2016-08-17 DIAGNOSIS — Z3A37 37 weeks gestation of pregnancy: Secondary | ICD-10-CM | POA: Diagnosis not present

## 2016-08-17 DIAGNOSIS — O365930 Maternal care for other known or suspected poor fetal growth, third trimester, not applicable or unspecified: Secondary | ICD-10-CM | POA: Diagnosis not present

## 2016-08-17 NOTE — Telephone Encounter (Signed)
Preadmission screen  

## 2016-08-18 ENCOUNTER — Inpatient Hospital Stay (HOSPITAL_COMMUNITY): Payer: BLUE CROSS/BLUE SHIELD | Admitting: Anesthesiology

## 2016-08-18 ENCOUNTER — Inpatient Hospital Stay (HOSPITAL_COMMUNITY)
Admission: RE | Admit: 2016-08-18 | Discharge: 2016-08-19 | DRG: 775 | Disposition: A | Discharge status: Home / Self Care | Payer: BLUE CROSS/BLUE SHIELD | Source: Ambulatory Visit | Attending: Obstetrics & Gynecology | Admitting: Obstetrics & Gynecology

## 2016-08-18 ENCOUNTER — Encounter (HOSPITAL_COMMUNITY): Payer: Self-pay

## 2016-08-18 DIAGNOSIS — O09523 Supervision of elderly multigravida, third trimester: Secondary | ICD-10-CM | POA: Diagnosis not present

## 2016-08-18 DIAGNOSIS — O4103X0 Oligohydramnios, third trimester, not applicable or unspecified: Secondary | ICD-10-CM | POA: Diagnosis not present

## 2016-08-18 DIAGNOSIS — Z8249 Family history of ischemic heart disease and other diseases of the circulatory system: Secondary | ICD-10-CM

## 2016-08-18 DIAGNOSIS — O365990 Maternal care for other known or suspected poor fetal growth, unspecified trimester, not applicable or unspecified: Secondary | ICD-10-CM

## 2016-08-18 DIAGNOSIS — O280 Abnormal hematological finding on antenatal screening of mother: Secondary | ICD-10-CM

## 2016-08-18 DIAGNOSIS — Z3A37 37 weeks gestation of pregnancy: Secondary | ICD-10-CM

## 2016-08-18 DIAGNOSIS — Z3490 Encounter for supervision of normal pregnancy, unspecified, unspecified trimester: Secondary | ICD-10-CM

## 2016-08-18 DIAGNOSIS — O365930 Maternal care for other known or suspected poor fetal growth, third trimester, not applicable or unspecified: Principal | ICD-10-CM | POA: Diagnosis present

## 2016-08-18 DIAGNOSIS — O43813 Placental infarction, third trimester: Secondary | ICD-10-CM | POA: Diagnosis not present

## 2016-08-18 HISTORY — DX: Maternal care for other known or suspected poor fetal growth, unspecified trimester, not applicable or unspecified: O36.5990

## 2016-08-18 LAB — CBC
HCT: 42.8 % (ref 36.0–46.0)
HEMOGLOBIN: 14.2 g/dL (ref 12.0–15.0)
MCH: 29.6 pg (ref 26.0–34.0)
MCHC: 33.2 g/dL (ref 30.0–36.0)
MCV: 89.2 fL (ref 78.0–100.0)
PLATELETS: 326 10*3/uL (ref 150–400)
RBC: 4.8 MIL/uL (ref 3.87–5.11)
RDW: 15.8 % — ABNORMAL HIGH (ref 11.5–15.5)
WBC: 10.6 10*3/uL — ABNORMAL HIGH (ref 4.0–10.5)

## 2016-08-18 LAB — ABO/RH: ABO/RH(D): A POS

## 2016-08-18 LAB — TYPE AND SCREEN
ABO/RH(D): A POS
Antibody Screen: NEGATIVE

## 2016-08-18 LAB — RPR: RPR: NONREACTIVE

## 2016-08-18 MED ORDER — DIPHENHYDRAMINE HCL 50 MG/ML IJ SOLN: 12.5000 mg | INTRAMUSCULAR | Status: DC | PRN

## 2016-08-18 MED ORDER — EPHEDRINE 5 MG/ML INJ
10.0000 mg | INTRAVENOUS | Status: DC | PRN
  Filled 2016-08-18: qty 2

## 2016-08-18 MED ORDER — ACETAMINOPHEN 325 MG PO TABS: 650.0000 mg | ORAL_TABLET | ORAL | Status: DC | PRN

## 2016-08-18 MED ORDER — SOD CITRATE-CITRIC ACID 500-334 MG/5ML PO SOLN
30.0000 mL | ORAL | Status: DC | PRN
  Filled 2016-08-18: qty 15

## 2016-08-18 MED ORDER — TERBUTALINE SULFATE 1 MG/ML IJ SOLN: 0.2500 mg | Freq: Once | INTRAMUSCULAR | Status: DC | PRN

## 2016-08-18 MED ORDER — PHENYLEPHRINE 40 MCG/ML (10ML) SYRINGE FOR IV PUSH (FOR BLOOD PRESSURE SUPPORT)
80.0000 ug | PREFILLED_SYRINGE | INTRAVENOUS | Status: DC | PRN
  Filled 2016-08-18: qty 5

## 2016-08-18 MED ORDER — DIPHENHYDRAMINE HCL 25 MG PO CAPS: 25.0000 mg | ORAL_CAPSULE | Freq: Four times a day (QID) | ORAL | Status: DC | PRN

## 2016-08-18 MED ORDER — FENTANYL 2.5 MCG/ML BUPIVACAINE 1/10 % EPIDURAL INFUSION (WH - ANES)
14.0000 mL/h | INTRAMUSCULAR | Status: DC | PRN
  Administered 2016-08-18: 14 mL/h via EPIDURAL
  Filled 2016-08-18: qty 100

## 2016-08-18 MED ORDER — PRENATAL MULTIVITAMIN CH
1.0000 | ORAL_TABLET | Freq: Every day | ORAL | Status: DC
  Administered 2016-08-19: 1 via ORAL
  Filled 2016-08-18: qty 1

## 2016-08-18 MED ORDER — OXYTOCIN BOLUS FROM INFUSION
500.0000 mL | Freq: Once | INTRAVENOUS | Status: AC
  Administered 2016-08-18: 500 mL via INTRAVENOUS

## 2016-08-18 MED ORDER — LIDOCAINE HCL (PF) 1 % IJ SOLN
30.0000 mL | INTRAMUSCULAR | Status: DC | PRN
  Filled 2016-08-18: qty 30

## 2016-08-18 MED ORDER — LIDOCAINE HCL (PF) 1 % IJ SOLN
INTRAMUSCULAR | Status: DC | PRN
  Administered 2016-08-18: 6 mL via EPIDURAL
  Administered 2016-08-18: 4 mL

## 2016-08-18 MED ORDER — TETANUS-DIPHTH-ACELL PERTUSSIS 5-2.5-18.5 LF-MCG/0.5 IM SUSP: 0.5000 mL | Freq: Once | INTRAMUSCULAR | Status: DC

## 2016-08-18 MED ORDER — OXYTOCIN 40 UNITS IN LACTATED RINGERS INFUSION - SIMPLE MED: 2.5000 [IU]/h | INTRAVENOUS | Status: DC

## 2016-08-18 MED ORDER — OXYCODONE-ACETAMINOPHEN 5-325 MG PO TABS: 1.0000 | ORAL_TABLET | ORAL | Status: DC | PRN

## 2016-08-18 MED ORDER — IBUPROFEN 600 MG PO TABS
600.0000 mg | ORAL_TABLET | Freq: Four times a day (QID) | ORAL | Status: DC
  Administered 2016-08-19 (×3): 600 mg via ORAL
  Filled 2016-08-18 (×4): qty 1

## 2016-08-18 MED ORDER — LACTATED RINGERS IV SOLN
INTRAVENOUS | Status: DC
  Administered 2016-08-18: 12:00:00 via INTRAUTERINE

## 2016-08-18 MED ORDER — LACTATED RINGERS IV SOLN
INTRAVENOUS | Status: DC
  Administered 2016-08-18 (×2): via INTRAVENOUS

## 2016-08-18 MED ORDER — LACTATED RINGERS IV SOLN
500.0000 mL | INTRAVENOUS | Status: DC | PRN
  Administered 2016-08-18: 500 mL via INTRAVENOUS

## 2016-08-18 MED ORDER — OXYTOCIN 40 UNITS IN LACTATED RINGERS INFUSION - SIMPLE MED
1.0000 m[IU]/min | INTRAVENOUS | Status: DC
  Administered 2016-08-18: 2 m[IU]/min via INTRAVENOUS
  Filled 2016-08-18: qty 1000

## 2016-08-18 MED ORDER — COCONUT OIL OIL: 1.0000 "application " | TOPICAL_OIL | Status: DC | PRN

## 2016-08-18 MED ORDER — LACTATED RINGERS IV SOLN
500.0000 mL | Freq: Once | INTRAVENOUS | Status: AC
  Administered 2016-08-18: 500 mL via INTRAVENOUS

## 2016-08-18 MED ORDER — PHENYLEPHRINE 40 MCG/ML (10ML) SYRINGE FOR IV PUSH (FOR BLOOD PRESSURE SUPPORT)
80.0000 ug | PREFILLED_SYRINGE | INTRAVENOUS | Status: DC | PRN
  Filled 2016-08-18: qty 10
  Filled 2016-08-18: qty 5

## 2016-08-18 MED ORDER — FLEET ENEMA 7-19 GM/118ML RE ENEM: 1.0000 | ENEMA | Freq: Every day | RECTAL | Status: DC | PRN

## 2016-08-18 MED ORDER — ONDANSETRON HCL 4 MG/2ML IJ SOLN: 4.0000 mg | INTRAMUSCULAR | Status: DC | PRN

## 2016-08-18 MED ORDER — SIMETHICONE 80 MG PO CHEW: 80.0000 mg | CHEWABLE_TABLET | ORAL | Status: DC | PRN

## 2016-08-18 MED ORDER — BENZOCAINE-MENTHOL 20-0.5 % EX AERO
1.0000 | INHALATION_SPRAY | CUTANEOUS | Status: DC | PRN
Start: 2016-08-18 — End: 2016-08-20

## 2016-08-18 MED ORDER — OXYCODONE-ACETAMINOPHEN 5-325 MG PO TABS: 2.0000 | ORAL_TABLET | ORAL | Status: DC | PRN

## 2016-08-18 MED ORDER — ONDANSETRON HCL 4 MG PO TABS: 4.0000 mg | ORAL_TABLET | ORAL | Status: DC | PRN

## 2016-08-18 MED ORDER — OXYTOCIN 40 UNITS IN LACTATED RINGERS INFUSION - SIMPLE MED: 1.0000 m[IU]/min | INTRAVENOUS | Status: DC

## 2016-08-18 MED ORDER — ONDANSETRON HCL 4 MG/2ML IJ SOLN: 4.0000 mg | Freq: Four times a day (QID) | INTRAMUSCULAR | Status: DC | PRN

## 2016-08-18 NOTE — Progress Notes (Signed)
Called by RN regarding  Variable decelerations on this pt who was scheduled for IOL by  Dr Pamala Hurry for IUGR, oligohydramnios. Pitocin which was 6 miu was discontinued. Pt imformed of the need to do AROM and placement of monitors for further mgmt of baby,. Pt also advised if baby is not tolerating labor, then she will need C/S. Per pt, she would prefer not to have a C/S but will if necessary  Chart reviewed Tracing also reviewed. Baseline 145 (+) variables ( lasting 20-40 secs). irreg ctx VE 3/80/-3 after arom no fluid noted. IUPC/ISE placed  IMP; oligohydramnios IUGR AMA P) start amnioinfusion.Marland Kitchen Await response to AROM. Close fetal monitoring prior to restarting any pitocin.

## 2016-08-18 NOTE — Lactation Note (Signed)
This note was copied from a baby's chart. Lactation Consultation Note  Patient Name: Tracy Crosby Tracy Crosby Date: 08/18/2016 Reason for consult: Initial assessment  Baby 3 hours old. Mom requested to start pumping. Assisted mom with DEBP initiation, and demonstrated assembly, disassembly and cleaning of pump parts. Enc mom to pump 8 times/24 hours followed by hand expression. Mom reports that she has a Medela DEBP at home. Discussed the benefits of hospital-grade pump, and mom aware of DEBP rentals. Mom enc to take entire pumping kit with her at D/C and aware of pumping rooms in NICU.   Maternal Data Has patient been taught Hand Expression?: Yes Does the patient have breastfeeding experience prior to this delivery?: Yes  Feeding Feeding Type: Formula Nipple Type: Slow - flow Length of feed: 10 min  LATCH Score/Interventions                      Lactation Tools Discussed/Used WIC Program: No Pump Review: Setup, frequency, and cleaning;Milk Storage Initiated by:: JW Date initiated:: 08/18/16   Consult Status Consult Status: Follow-up Date: 08/19/16 Follow-up type: In-patient    Andres Labrum 08/18/2016, 6:55 PM

## 2016-08-18 NOTE — Progress Notes (Signed)
Pt wheeled down to NICU.

## 2016-08-18 NOTE — H&P (Signed)
Tracy Crosby is a 41 y.o. female presenting for IOL 2nd to IUGR, oligohydramnios. BMZ complete. Nl panorama. Unexplained elev AFP.  Son) ( 36 2/7 weeks). 3lb 5 oz( 0%) nl dopplers. oligo OB History    Gravida Para Term Preterm AB Living   8 3 3  0 4 3   SAB TAB Ectopic Multiple Live Births   4 0 0 0 3     Past Medical History:  Diagnosis Date  . Acute blood loss anemia 01/01/2011  . IUGR (intrauterine growth restriction) affecting care of mother   . Maternal iron deficiency anemia complicating pregnancy 0/37/0488  . No pertinent past medical history   . Routine postpartum follow-up 01/01/2011  . Vaginal Pap smear, abnormal    Past Surgical History:  Procedure Laterality Date  . LEEP    . NO PAST SURGERIES     Family History: family history includes Asthma in her brother; Birth defects in her brother; Depression in her mother; Hypertension in her mother. Social History:  reports that she has never smoked. She has never used smokeless tobacco. She reports that she does not drink alcohol or use drugs.     Maternal Diabetes: No Genetic Screening: Abnormal:  Results: Elevated AFP, Other:nl panaroma Maternal Ultrasounds/Referrals: Abnormal:  Findings:   Other:IUGR Fetal Ultrasounds or other Referrals:  Referred to Materal Fetal Medicine  Maternal Substance Abuse:  No Significant Maternal Medications:  None Significant Maternal Lab Results:  Lab values include: Group B Strep negative Other Comments:  BMZ complete. IUGR, oligohydramnios  Review of Systems  All other systems reviewed and are negative.  History Dilation: 3 Effacement (%): 80 Station: -2 Exam by:: dr Tracy Crosby Blood pressure (!) 139/101, pulse 89, temperature 99 F (37.2 C), temperature source Oral, resp. rate 18, height 5' 1"  (1.549 m), weight 59 kg (130 lb), SpO2 96 %, unknown if currently breastfeeding. Exam Physical Exam  Constitutional: She is oriented to person, place, and time. She appears well-developed and  well-nourished.  Eyes: EOM are normal.  Neck: Neck supple.  Cardiovascular: Regular rhythm.   Respiratory: Effort normal.  GI: Soft.  Musculoskeletal: Normal range of motion.  Neurological: She is alert and oriented to person, place, and time.  Skin: Skin is warm and dry.  Psychiatric: She has a normal mood and affect.    Prenatal labs: ABO, Rh: A/Positive/-- (10/02 0000) Antibody: Negative (10/02 0000) Rubella: Immune (10/02 0000) RPR: Nonreactive (10/02 0000)  HBsAg: Negative (10/02 0000)  HIV: Non-reactive (10/02 0000)  GBS: Negative (03/20 0000)   Assessment/Plan: Severe IUGR Unexplained elev AFP Oligohydramnios IUP @ 37 1/7 week AMA>40 P) admit routine labs. Pitocin ordered by Dr Tracy Crosby. Epidural planned   Tracy Crosby A 08/18/2016, 12:10 PM

## 2016-08-18 NOTE — Anesthesia Preprocedure Evaluation (Signed)
Anesthesia Evaluation  Patient identified by MRN, date of birth, ID band Patient awake    Reviewed: Allergy & Precautions, H&P , Patient's Chart, lab work & pertinent test results  Airway Mallampati: II  TM Distance: >3 FB Neck ROM: full    Dental  (+) Teeth Intact   Pulmonary    breath sounds clear to auscultation       Cardiovascular  Rhythm:regular Rate:Normal     Neuro/Psych    GI/Hepatic   Endo/Other    Renal/GU      Musculoskeletal   Abdominal   Peds  Hematology   Anesthesia Other Findings       Reproductive/Obstetrics (+) Pregnancy                             Anesthesia Physical Anesthesia Plan  ASA: II  Anesthesia Plan: Epidural   Post-op Pain Management:    Induction:   Airway Management Planned:   Additional Equipment:   Intra-op Plan:   Post-operative Plan:   Informed Consent: I have reviewed the patients History and Physical, chart, labs and discussed the procedure including the risks, benefits and alternatives for the proposed anesthesia with the patient or authorized representative who has indicated his/her understanding and acceptance.   Dental Advisory Given  Plan Discussed with:   Anesthesia Plan Comments: (Labs checked- platelets confirmed with RN in room. Fetal heart tracing, per RN, reported to be stable enough for sitting procedure. Discussed epidural, and patient consents to the procedure:  included risk of possible headache,backache, failed block, allergic reaction, and nerve injury. This patient was asked if she had any questions or concerns before the procedure started.)        Anesthesia Quick Evaluation

## 2016-08-19 NOTE — Lactation Note (Signed)
Lactation Consultation Note Mom is being D/C tonight and her DEBP has not come in from her insurance. She requested 2 wk loaner. She is not a WIC clt. Reviewed pump use and milk storage. Paperwork completed and placed on [facility] desk. IPayment was completed and signed credit card sheet is in the [employer].   Patient Name: Tracy Crosby ASUOR'V Date: 08/19/2016 Reason for consult: Follow-up assessment  Lactation Tools Discussed/Used Pump Review: Setup, frequency, and cleaning;Milk Storage Initiated by:: ES Date initiated:: 08/19/16  Consult Status Consult Status: PRN  Denzil Hughes 08/19/2016, 7:03 PM

## 2016-08-19 NOTE — Anesthesia Postprocedure Evaluation (Signed)
Anesthesia Post Note  Patient: Tracy Crosby  Procedure(s) Performed: * No procedures listed *  Patient location during evaluation: [facility] Anesthesia Type: Epidural Level of consciousness: awake, awake and alert, oriented and patient cooperative Pain management: pain level controlled Vital Signs Assessment: post-procedure vital signs reviewed and stable Respiratory status: spontaneous breathing, nonlabored ventilation and respiratory function stable Cardiovascular status: stable Postop Assessment: no headache, no backache, patient able to bend at knees and no signs of nausea or vomiting Anesthetic complications: no        Last Vitals:  Vitals:   08/18/16 1754 08/19/16 0015  BP: 123/63 118/74  Pulse: (!) 103 96  Resp: 18 18  Temp: 37.4 C 37.1 C    Last Pain:  Vitals:   08/19/16 0636  TempSrc:   PainSc: 0-No pain   Pain Goal: Patients Stated Pain Goal: 1 (08/19/16 0015)               Allycia Pitz L

## 2016-08-19 NOTE — Lactation Note (Signed)
This note was copied from a baby's chart. Lactation Consultation Note  Patient Name: Tracy Crosby DSKAJ'G Date: 08/19/2016 Reason for consult: Follow-up assessment;NICU baby  NICU baby 2 hours old. Assisted mom to latch baby to left breast. Mom's left nipple large, and does not evert easily. Attempted to latch baby, but baby not stimulated to suckle by left nipple. Fitted mom with a #20 NS, and baby able to latch deeply and suckle rhythmically, but mom stated that the #20 pinched her nipple. Refitted mom with a #24 NS, and baby able to latch deeply and suckly rhythmically again. However, no milk present initially in the NS. Mom is seeing drops when pumping, so enc mom to pump after nursing. Discussed with mom and bedside nurse that NS a temporary device, and used to help evert the nipple. Discussed with mom how to clean and apply, and the need to continue pumping as long as using NS. Mom states that the baby is latching to right breast well, so she does not need the shield for right breast. Enc mom to keep latching with the NS as needed, but to remove when milk baby has the milk flowing and nipple is everted in order to get the baby's mouth directly on the breast. Discussed assessment and interventions with bedside RN, Joellen Jersey.  Maternal Data    Feeding Feeding Type: Breast Fed Nipple Type: Slow - flow Length of feed:  (LC assessed first 15 minutes of BF.)  LATCH Score/Interventions Latch: Repeated attempts needed to sustain latch, nipple held in mouth throughout feeding, stimulation needed to elicit sucking reflex. Intervention(s): Adjust position;Assist with latch;Breast compression  Audible Swallowing: None  Type of Nipple: Everted at rest and after stimulation  Comfort (Breast/Nipple): Soft / non-tender     Hold (Positioning): Assistance needed to correctly position infant at breast and maintain latch. Intervention(s): Breastfeeding basics reviewed;Support Pillows;Position  options;Skin to skin  LATCH Score: 6  Lactation Tools Discussed/Used Tools: Nipple Shields Nipple shield size: 24   Consult Status Consult Status: Follow-up Date: 08/20/16 Follow-up type: In-patient    Andres Labrum 08/19/2016, 2:29 PM

## 2016-08-19 NOTE — Discharge Summary (Signed)
Obstetric Discharge Summary Reason for Admission: induction of labor  At 37.2 wks for severe IUGR and Oligohydramnios. Pitocin IOL, delivered same day, SVD. Female.  Prenatal Procedures: ultrasound and serial antenatal testing, MFM consult Intrapartum Procedures: spontaneous vaginal delivery Postpartum Procedures: none Complications-Operative and Postpartum: none Hemoglobin  Date Value Ref Range Status  08/18/2016 14.2 12.0 - 15.0 g/dL Final   HCT  Date Value Ref Range Status  08/18/2016 42.8 36.0 - 46.0 % Final    Physical Exam:  General: alert and cooperative Lochia: appropriate Uterine Fundus: firm Incision: N/A DVT Evaluation: No evidence of DVT seen on physical exam.  Discharge Diagnoses: Term Pregnancy-delivered  Discharge Information: Date: 08/19/2016 Activity: pelvic rest Diet: routine Medications: PNV and Ibuprofen Condition: stable Instructions: refer to practice specific booklet Discharge to: home Follow-up Information    FOGLEMAN,KELLY A., MD Follow up in 6 week(s).   Specialty:  Obstetrics and Gynecology Contact information: [address] [place] [id number] [phone]           Newborn Data: Live born female  Birth Weight: 3 lb 5.3 oz (1510 g) APGAR: 8, 9  Remains stable in NICU, severe SGA   Tracy Crosby 08/19/2016, 1:10 PM

## 2016-08-19 NOTE — Progress Notes (Signed)
Post Partum Day 1, SVD, baby in NICU for severe SGA Subjective: no complaints, up ad lib, voiding and tolerating PO  Objective: Blood pressure (!) 97/59, pulse 89, temperature 98.4 F (36.9 C), temperature source Oral, resp. rate 18, height 5' 1"  (1.549 m), weight 130 lb (59 kg), SpO2 99 %, unknown if currently breastfeeding.  Physical Exam:  General: alert and cooperative Lochia: appropriate Uterine Fundus: firm DVT Evaluation: No evidence of DVT seen on physical exam. Negative Homan's sign.   Recent Labs  08/18/16 0800  HGB 14.2  HCT 42.8  Rh positive  Rub Imm  Assessment/Plan: Discharge home oer pt request. Call office for breast pump details PP care reviewed F/up Dr Pamala Hurry 6 wks  GIRL, SGA 1500 gms at 37 wks, in NICU but stable    LOS: 1 day   Japneet Staggs R 08/19/2016, 1:07 PM

## 2016-08-19 NOTE — Progress Notes (Signed)
Patient discharged with family members, ambulatory to car with V.Pittman, NT.  Questions offered and answered. No complaints at the time of discharged.

## 2016-08-19 NOTE — Progress Notes (Signed)
Patient discharged with printed instructions. Pt verbalized an understanding. No concerns noted. Toya Smothers, RN

## 2016-08-19 NOTE — Lactation Note (Signed)
This note was copied from a baby's chart. Lactation Consultation Note  Patient Name: Tracy Crosby IHDTP'N Date: 08/19/2016 Reason for consult: Follow-up assessment;NICU baby  NICU baby 40 hours old. Mom reports that she is not seeing a lot of EBM. Discussed progression of milk coming to volume and enc continuing to pump every 2-3 hours followed by hand expression. Mom states that the baby is latching to the right breast fine, but is having some trouble with the left nipple. This LC offered to assist with a latch at the next feeding and mom agreed.   Maternal Data    Feeding Feeding Type: Formula Nipple Type: Slow - flow Length of feed: 10 min  LATCH Score/Interventions Latch: Repeated attempts needed to sustain latch, nipple held in mouth throughout feeding, stimulation needed to elicit sucking reflex.  Audible Swallowing: A few with stimulation  Type of Nipple: Everted at rest and after stimulation  Comfort (Breast/Nipple): Soft / non-tender     Hold (Positioning): Assistance needed to correctly position infant at breast and maintain latch.  LATCH Score: 7  Lactation Tools Discussed/Used     Consult Status Consult Status: Follow-up Date: 08/19/16 Follow-up type: In-patient    Andres Labrum 08/19/2016, 11:48 AM

## 2016-08-24 ENCOUNTER — Ambulatory Visit: Payer: Self-pay

## 2016-08-24 ENCOUNTER — Inpatient Hospital Stay (HOSPITAL_COMMUNITY): Payer: BLUE CROSS/BLUE SHIELD

## 2016-08-24 NOTE — Lactation Note (Signed)
This note was copied from a baby's chart. Lactation Consultation Note  Patient Name: Tracy Crosby EYEMV'V Date: 08/24/2016 Reason for consult: Follow-up assessment;NICU baby;Infant < 6lbs   Brought mom colosrtum collection containers per RN/mom's request. Mom is getting minute amounts of  Colostrum. She is pumping every 3-4 hours. Discussed supply and demand and importance to pump 8-12 x a day to stimulate a milk production. Mom voiced understanding. Mom reports both sides of her pump do not always work, discussed trouble shooting options and enc mom to call when she pumps again to assist her if needed. Mom voiced understanding. No other questions/concerns at this time.    Maternal Data Formula Feeding for Exclusion: No  Feeding Feeding Type: Breast Milk Length of feed: 30 min  LATCH Score/Interventions                      Lactation Tools Discussed/Used Pump Review: Setup, frequency, and cleaning Initiated by:: Reviewed and encouraged   Consult Status Consult Status: PRN Follow-up type: Call as needed    Donn Pierini 08/24/2016, 11:00 AM

## 2016-08-27 ENCOUNTER — Ambulatory Visit: Payer: Self-pay

## 2016-08-27 NOTE — Lactation Note (Signed)
This note was copied from a baby's chart. Lactation Consultation Note  Patient Name: [name] Today's Date: 08/27/2016  Saw Mom in the NICU today.  She is only able to pump 5 times per day due to caring for her other 3 children at home.  Baby is 45 days old and mom pumping 8-1ms.  She states she had a low supply with her previous babies and needed to supplement with formula.  Encouraged to try to work up to 8 pumpings per day when possible.   Maternal Data    Feeding Feeding Type: Formula Nipple Type: Slow - flow Length of feed: 30 min  LATCH Score/Interventions                      Lactation Tools Discussed/Used     Consult Status      MAve Filter4/04/2017, 2:11 PM

## 2016-09-03 ENCOUNTER — Ambulatory Visit: Payer: Self-pay

## 2016-09-03 NOTE — Lactation Note (Signed)
This note was copied from a baby's chart. Lactation Consultation Note  Patient Name: Tracy CrosbyT Date: 09/03/2016 Reason for consult: Follow-up assessment;NICU baby;Infant < 6lbs   Spoke with mom at infant's bedside. Mom reports she is pumping 20 minutes 6 x a day. She reports she is getting a little more breast milk. She has no questions/concerns at this time.    Maternal Data    Feeding Feeding Type: Breast Milk with Formula added Nipple Type: Dr. Roosvelt Harps Preemie Length of feed: 30 min (20 PO/ 10 NG)  LATCH Score/Interventions                      Lactation Tools Discussed/Used Initiated by:: Reviewed   Consult Status Consult Status: PRN Follow-up type: Call as needed    Donn Pierini 09/03/2016, 12:13 PM

## 2017-11-23 DIAGNOSIS — Z681 Body mass index (BMI) 19 or less, adult: Secondary | ICD-10-CM | POA: Diagnosis not present

## 2017-11-23 DIAGNOSIS — Z1231 Encounter for screening mammogram for malignant neoplasm of breast: Secondary | ICD-10-CM | POA: Diagnosis not present

## 2017-11-23 DIAGNOSIS — Z01419 Encounter for gynecological examination (general) (routine) without abnormal findings: Secondary | ICD-10-CM | POA: Diagnosis not present

## 2018-11-30 DIAGNOSIS — Z01419 Encounter for gynecological examination (general) (routine) without abnormal findings: Secondary | ICD-10-CM | POA: Diagnosis not present

## 2018-11-30 DIAGNOSIS — Z681 Body mass index (BMI) 19 or less, adult: Secondary | ICD-10-CM | POA: Diagnosis not present

## 2018-11-30 DIAGNOSIS — Z1231 Encounter for screening mammogram for malignant neoplasm of breast: Secondary | ICD-10-CM | POA: Diagnosis not present

## 2018-11-30 DIAGNOSIS — Z1151 Encounter for screening for human papillomavirus (HPV): Secondary | ICD-10-CM | POA: Diagnosis not present

## 2019-01-26 IMAGING — US US MFM UA CORD DOPPLER
1 series · 14 of 28 positions shown · non-contrast
Comparison: none

[Series 1: us mfm ua cord doppler · 41 acquisitions, 14 frames shown]
[im 2/41]
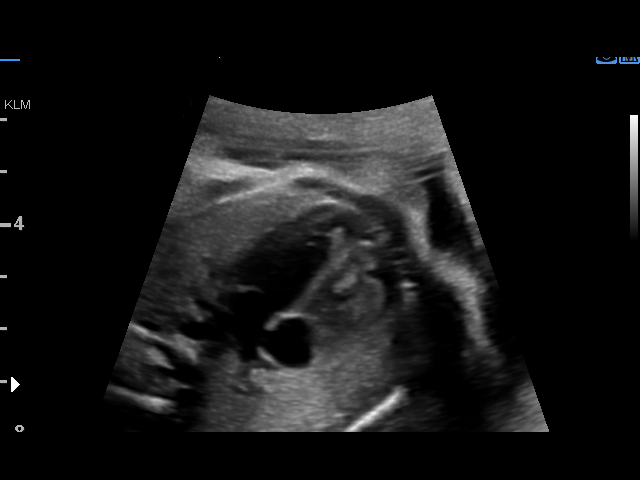
[im 5/41]
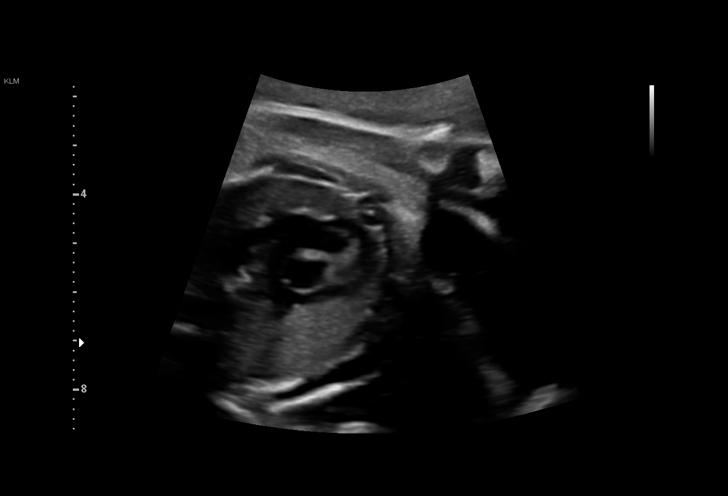
[im 8/41]
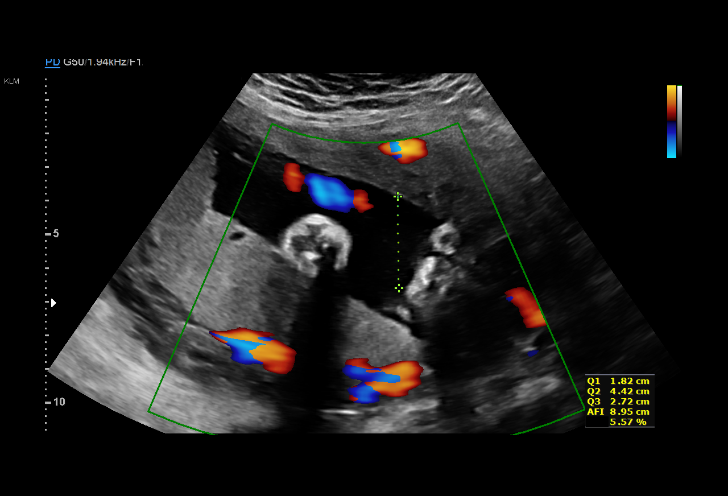
[im 11/41]
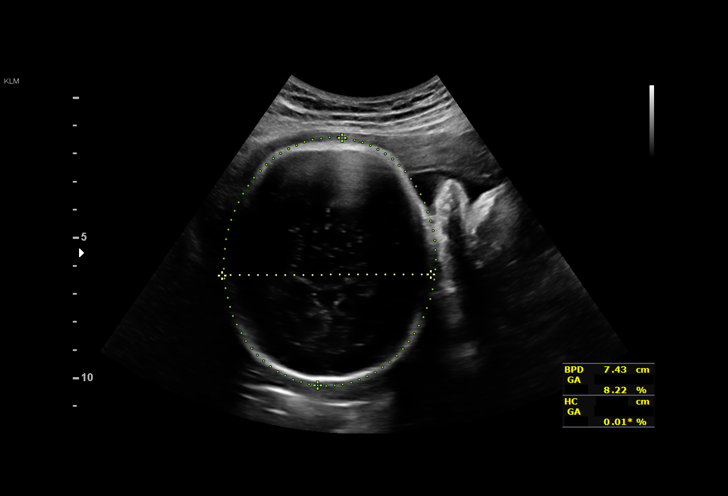
[im 14/41]
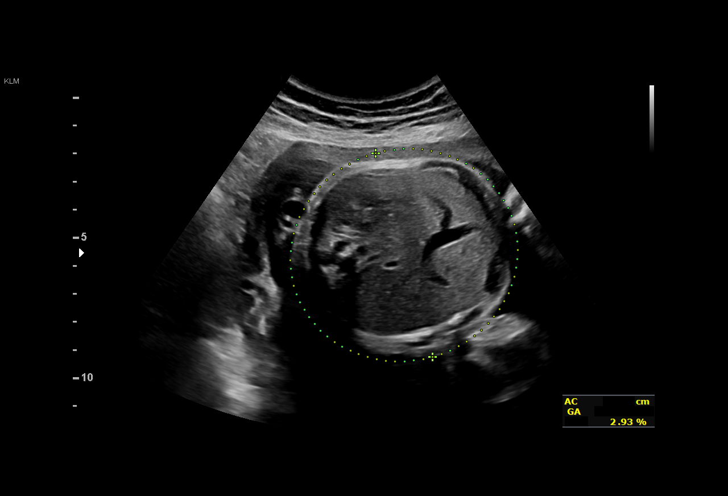
[im 17/41]
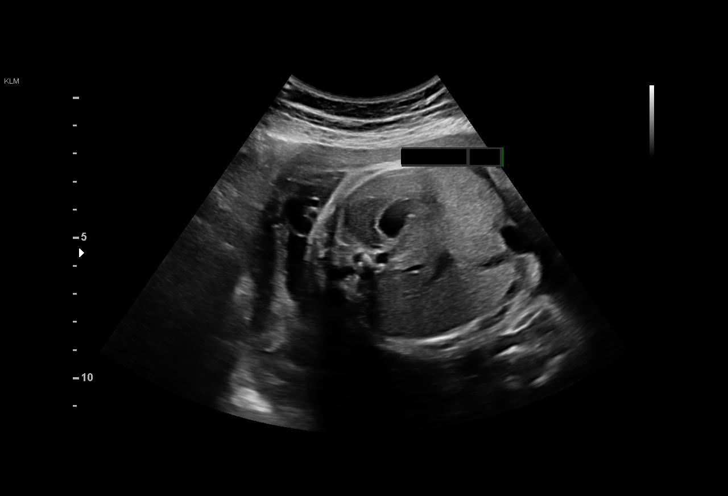
[im 20/41]
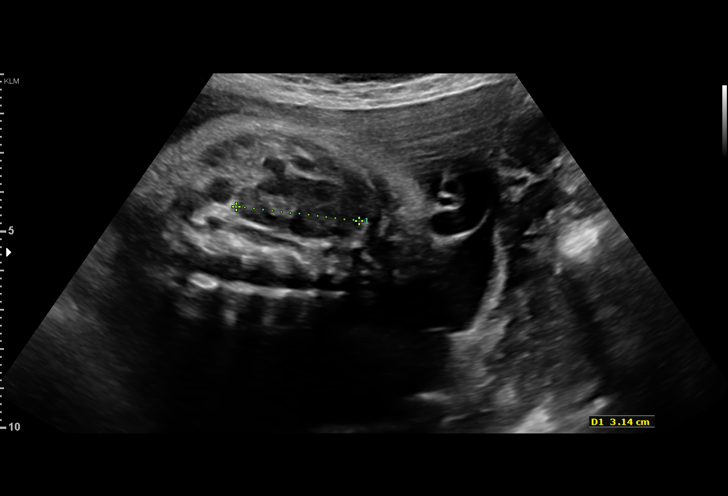
[im 23/41]
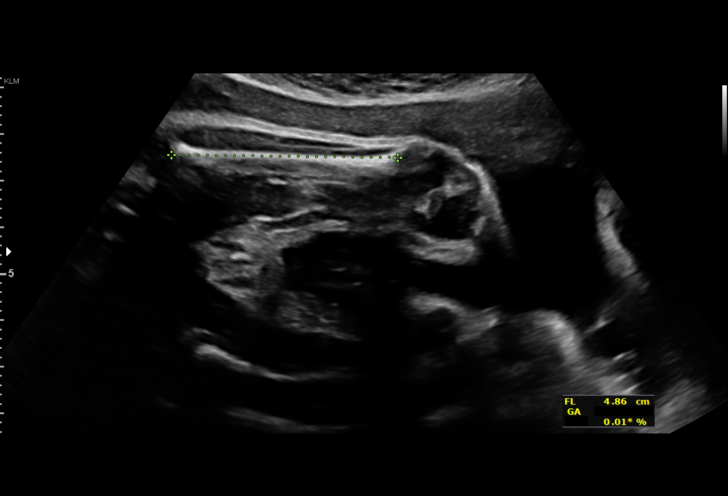
[im 26/41]
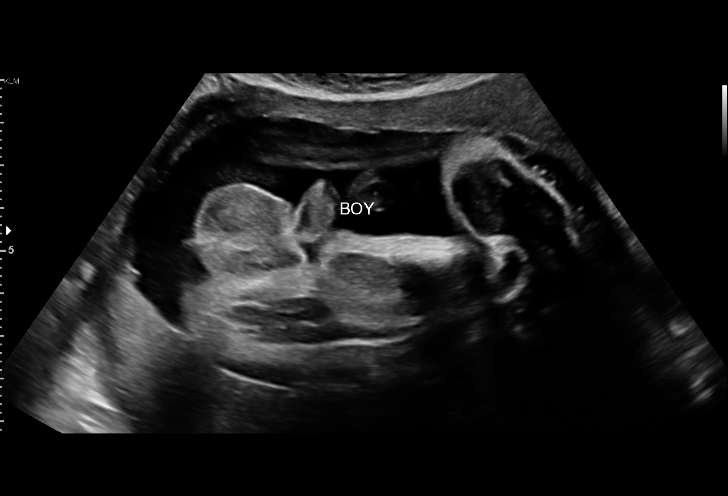
[im 29/41]
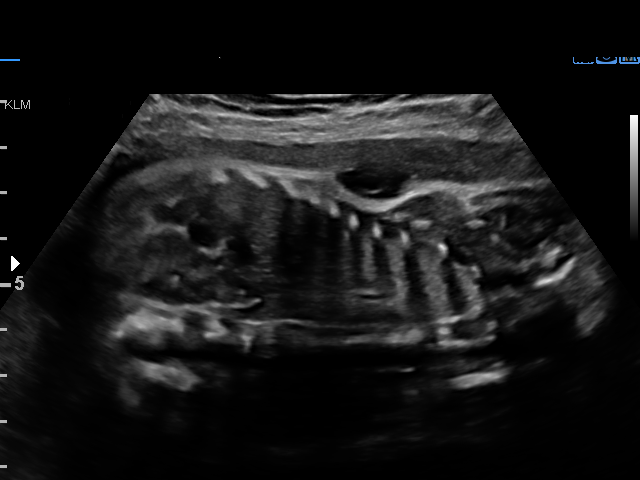
[im 32/41]
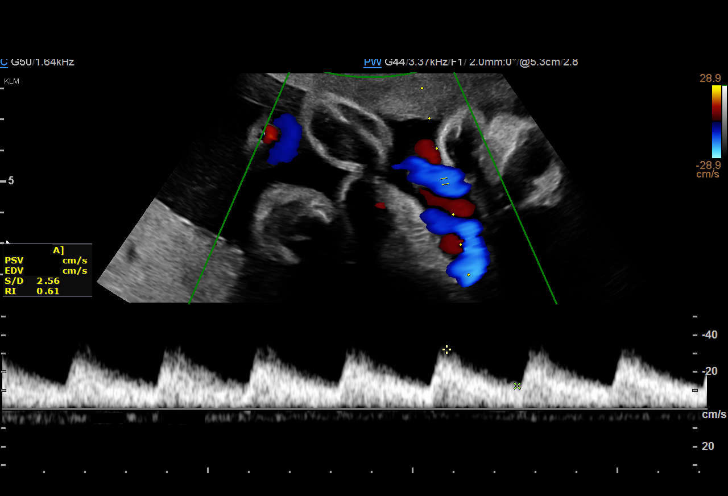
[im 35/41]
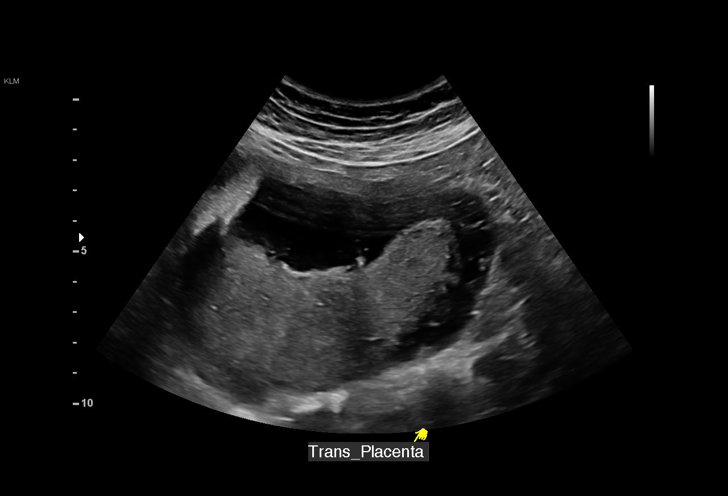
[im 38/41]
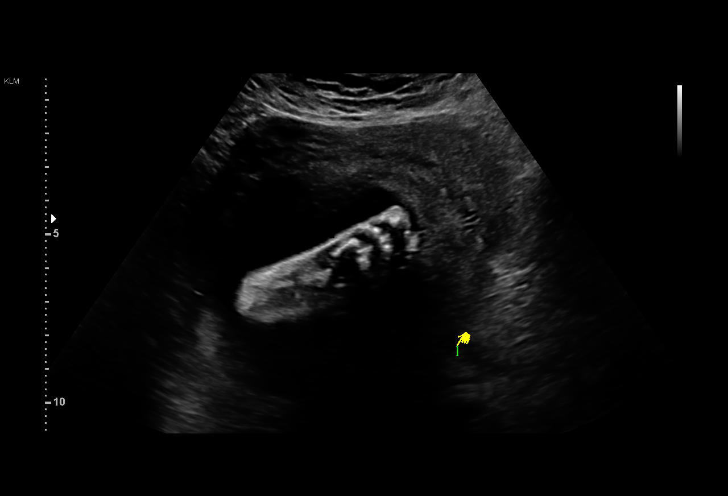
[im 41/41]
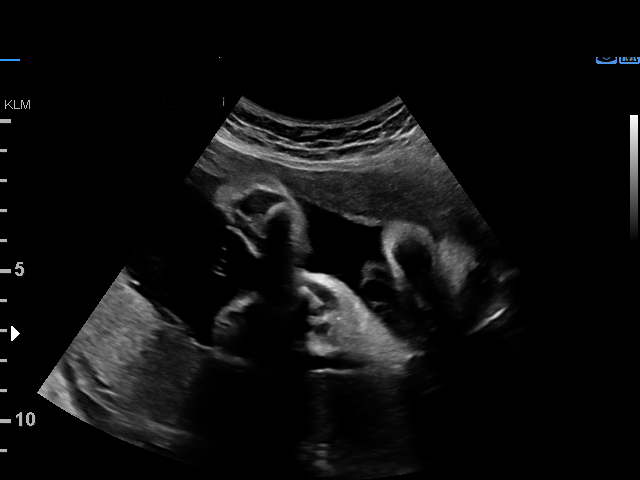

[14 of 28 positions shown; findings below may reference images not displayed]

pm)

OB/GYN &
Infertility
[id number] [HOSPITAL]

1  MICHELLE ANGE [facility]           [id number]      [id number]     [id number]
2  MICHELLE ANGE [facility]           [id number]      [id number]     [id number]
3  MICHELLE ANGE [facility]           [id number]      [id number]     [id number]
Indications

31 weeks gestation of pregnancy
Advanced maternal age multigravida (40),
second trimester
Abnormal biochemical screen, AFP 3.35, low
risk NIPS
Small for gestational age fetus affecting
management of mother
Encounter for other antenatal screening
follow-up
OB History

Gravidity:    8         Term:   3         SAB:   4
Living:       3
Fetal Evaluation

Num Of Fetuses:     1
Fetal Heart         133
Rate(bpm):
Cardiac Activity:   Observed
Presentation:       Cephalic
Placenta:           Posterior, above cervical os
P. Cord Insertion:  Previously Visualized
Amniotic Fluid
AFI FV:      Subjectively within normal limits

AFI Sum(cm)     %Tile       Largest Pocket(cm)
13.06           39

RUQ(cm)       RLQ(cm)       LUQ(cm)        LLQ(cm)
1.82

Comment:    [DATE] BPP in 17 mins.
Biophysical Evaluation

Amniotic F.V:   Within normal limits       F. Tone:        Observed
F. Movement:    Observed                   Score:          [DATE]
F. Breathing:   Observed
Biometry

BPD:      73.1  mm     G. Age:  29w 2d          4  %    CI:        81.03   %   70 - 86
FL/HC:      19.0   %   19.3 -
HC:      256.4  mm     G. Age:  27w 6d        < 3  %    HC/AC:      1.06       0.96 -
AC:      241.1  mm     G. Age:  28w 3d        < 3  %    FL/BPD:     66.8   %   71 - 87
FL:       48.8  mm     G. Age:  26w 3d        < 3  %    FL/AC:      20.2   %   20 - 24

Est. FW:    3336  gm      2 lb 7 oz   < 10  %
Gestational Age

LMP:           31w 1d       Date:   12/02/15                 EDD:   09/07/16
U/S Today:     28w 0d                                        EDD:   09/29/16
Best:          31w 1d    Det. By:   LMP  (12/02/15)          EDD:   09/07/16
Anatomy

Cranium:               Appears normal         Aortic Arch:            Previously seen
Cavum:                 Previously seen        Ductal Arch:            Appears normal
Ventricles:            Previously seen        Diaphragm:              Appears normal
Choroid Plexus:        Previously seen        Stomach:                Appears normal, left
sided
Cerebellum:            Previously seen        Abdomen:                Appears normal
Posterior Fossa:       Previously seen        Abdominal Wall:         Previously seen
Nuchal Fold:           Not applicable (>20    Cord Vessels:           Previously seen
wks GA)
Face:                  Orbits prev. seen,     Kidneys:                Appear normal
profile not seen
Lips:                  Previously seen        Bladder:                Appears normal
Thoracic:              Appears normal         Spine:                  Previously seen
Heart:                 Appears normal         Upper Extremities:      Previously seen
(4CH, axis, and
situs)
RVOT:                  Appears normal         Lower Extremities:      Previously seen
LVOT:                  Appears normal
Doppler - Fetal Vessels

Umbilical Artery
S/D     %tile                                     PSV    ADFV    RDFV
(cm/s)
2.[REDACTED]      No      No
Cervix Uterus Adnexa

Cervix
Not visualized (advanced GA >75wks)
Impression

SIUP at 31+1 weeks
Normal interval anatomy; anatomic survey complete except
for profile
Normal amniotic fluid volume
EFW < 10th %tile; AC < 3rd %tile
UA dopplers were normal for this GA
BPP [DATE]
Recommendations

Begin twice NSTs with weekly AFIs or weekly BPPs
Weekly UA dopplers
Growth US in 3 weeks
Deliver by 38-39 weeks if all testing remains reassuring
Deliver by 37 weeks if UA dopplers become abnormal
(elevated)

## 2019-03-04 IMAGING — US US MFM FETAL BPP W/O NON-STRESS
1 series · 13 of 28 positions shown · non-contrast
Comparison: none

[Series 1: us mfm fetal bpp w/o non-stress · 38 acquisitions, 13 frames shown]
[im 2/38]
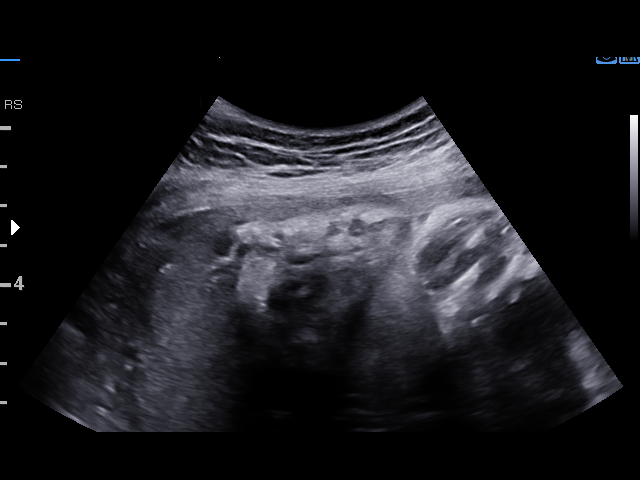
[im 5/38]
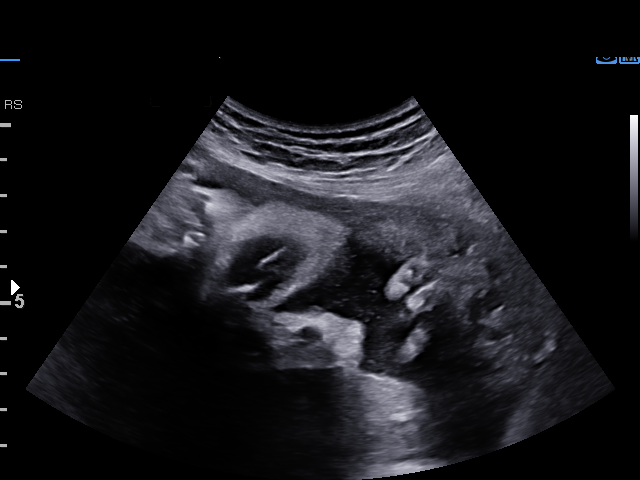
[im 7/38]
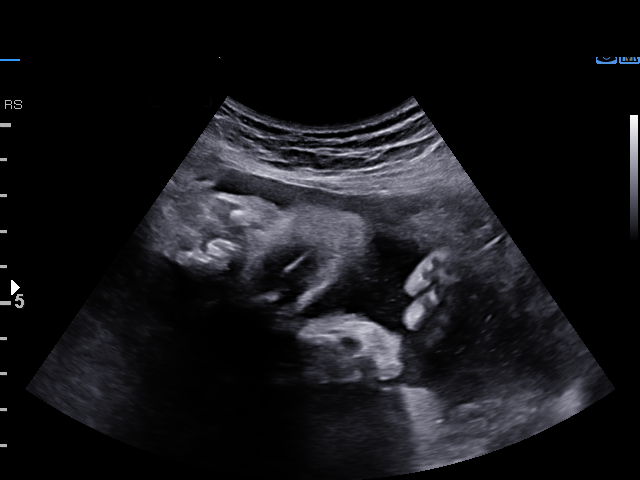
[im 10/38]
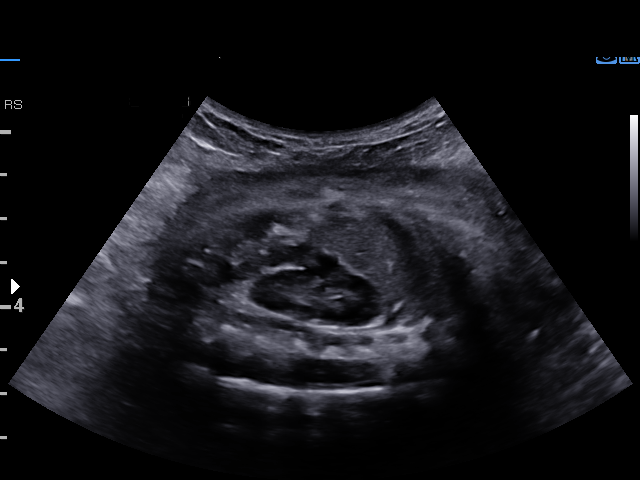
[im 13/38]
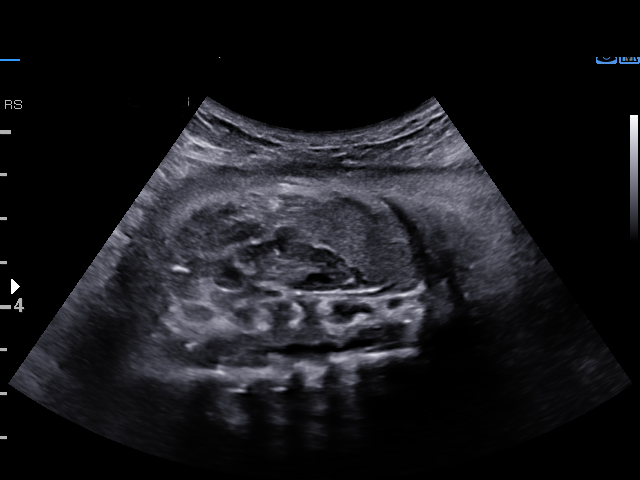
[im 16/38]
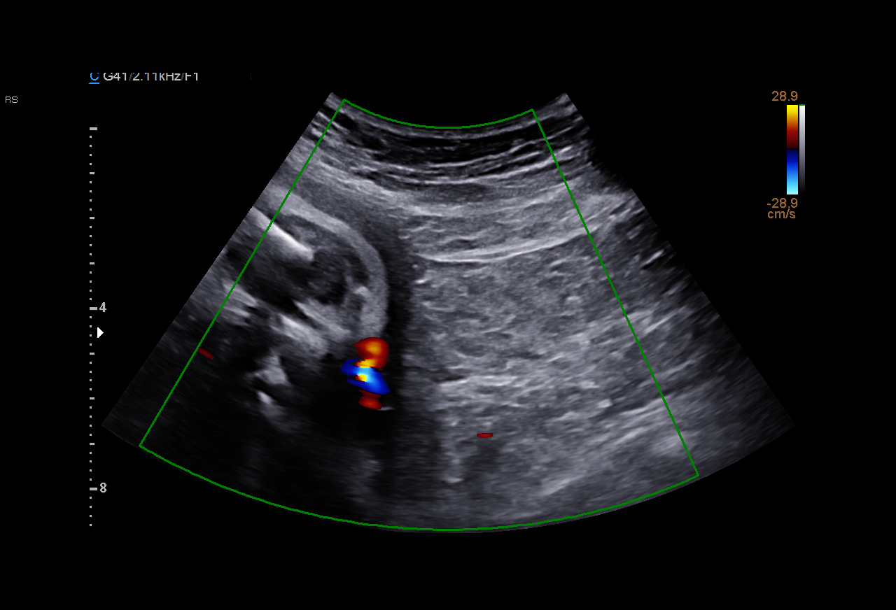
[im 20/38]
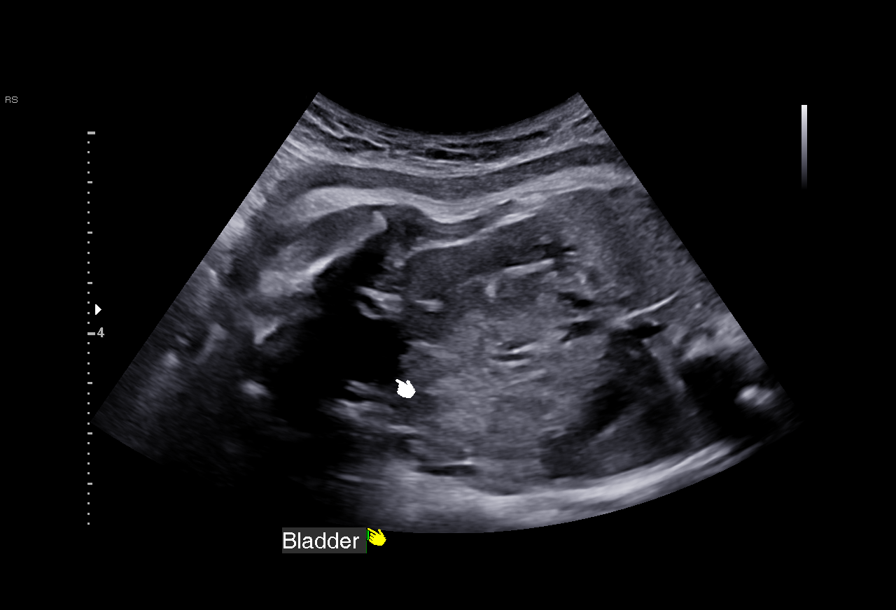
[im 22/38]
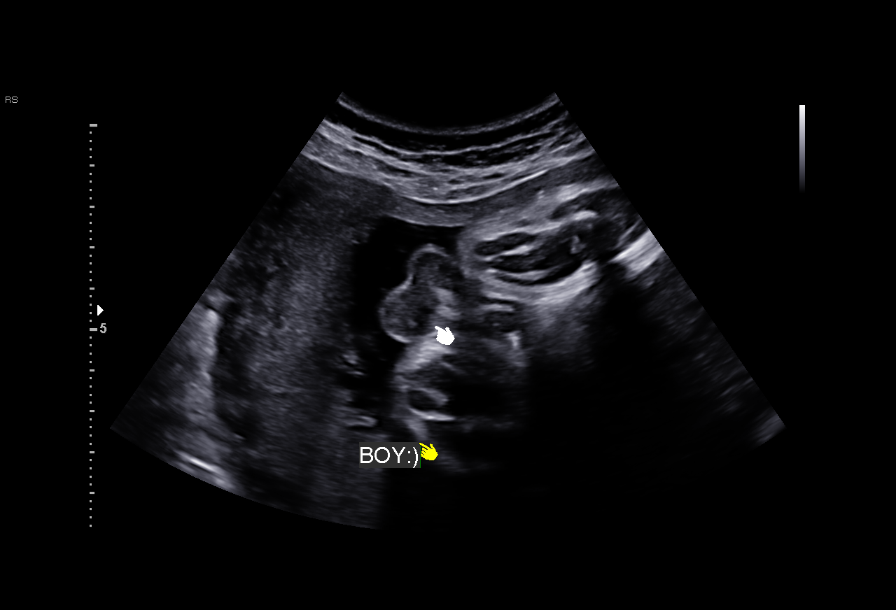
[im 25/38]
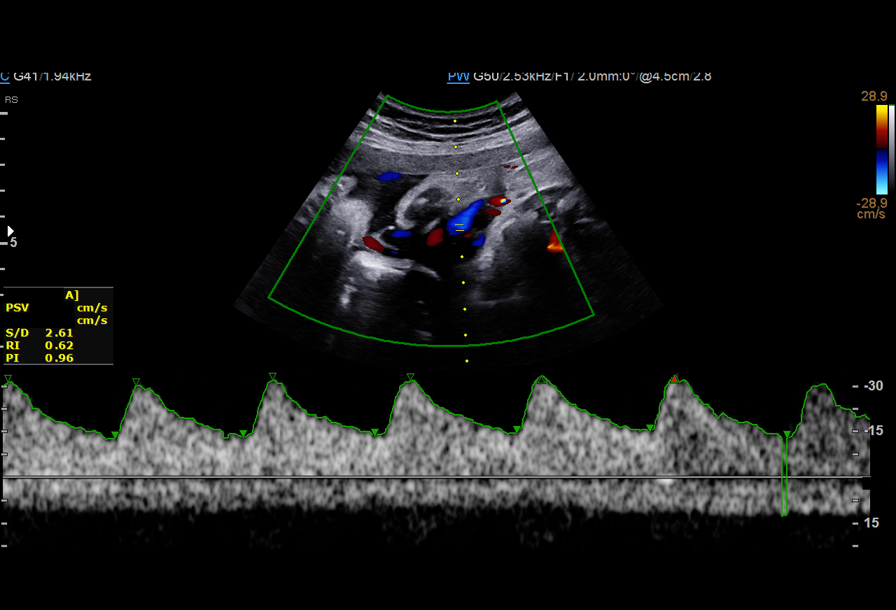
[im 28/38]
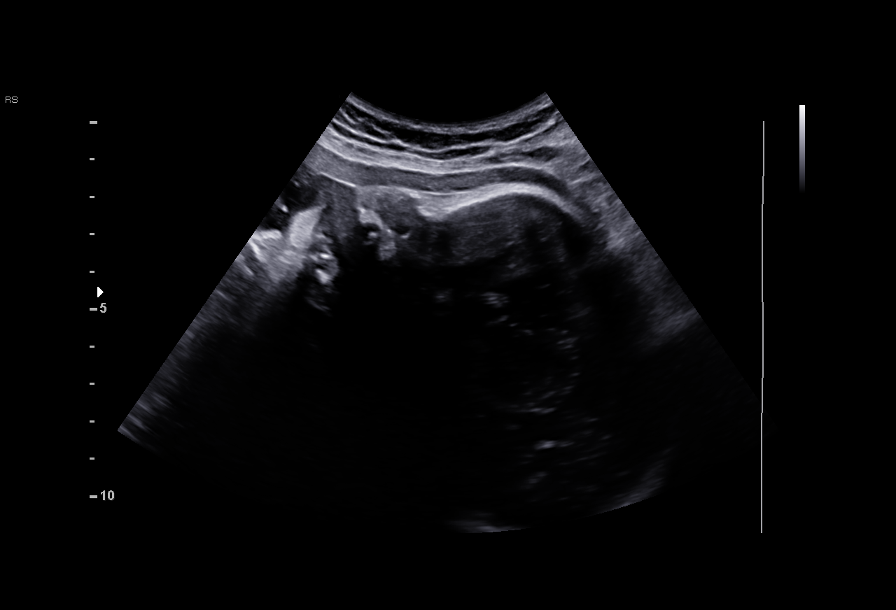
[im 31/38]
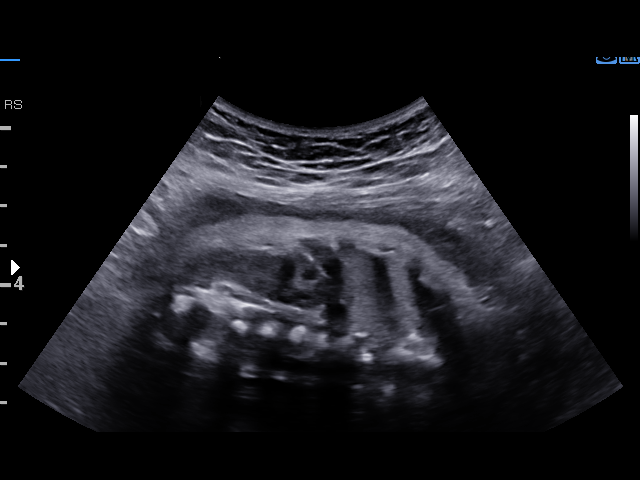
[im 33/38]
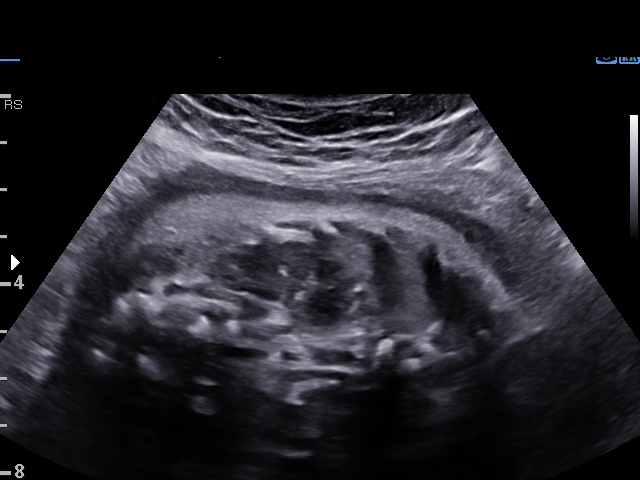
[im 36/38]
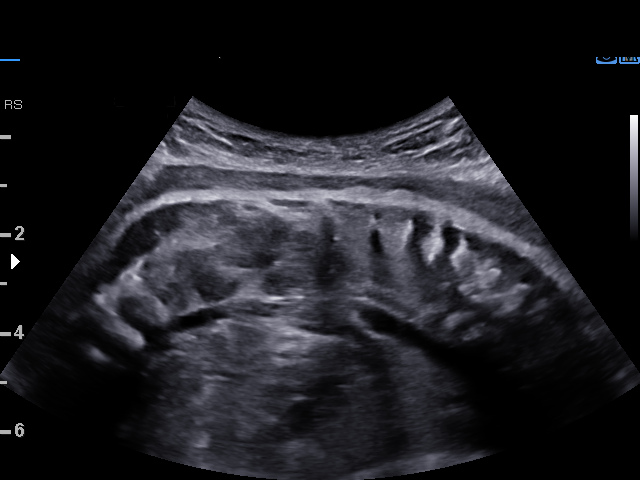

[13 of 28 positions shown; findings below may reference images not displayed]

pm)

OB/GYN &
Infertility
[id number] [HOSPITAL]

1  QUIRIJN AMAZIGH           [id number]      [id number]     [id number]
2  QUIRIJN AMAZIGH           [id number]      [id number]     [id number]
Indications

36 weeks gestation of pregnancy
Advanced maternal age multigravida (40),
second trimester
Abnormal biochemical screen, AFP 3.35, low
risk NIPS
Oligohydramnios / Decreased amniotic fluid
volume
OB History

Gravidity:    8         Term:   3         SAB:   4
Living:       3
Fetal Evaluation

Num Of Fetuses:     1
Fetal Heart         148
Rate(bpm):
Cardiac Activity:   Observed
Presentation:       Cephalic

Amniotic Fluid
AFI FV:      Subjectively low-normal

AFI Sum(cm)     %Tile       Largest Pocket(cm)
8.37            11
RUQ(cm)                     LUQ(cm)        LLQ(cm)
3.73
Biophysical Evaluation

Amniotic F.V:   Pocket => 2 cm two         F. Tone:        Observed
planes
F. Movement:    Observed                   N.S.T:          Reactive
F. Breathing:   Observed                   Score:          [DATE]
Gestational Age

LMP:           36w 3d       Date:   12/02/15                 EDD:   09/07/16
Best:          36w 3d    Det. By:   LMP  (12/02/15)          EDD:   09/07/16
Doppler - Fetal Vessels

Umbilical Artery
S/D     %tile     RI              PI              PSV    ADFV    RDFV
(cm/s)
2.46       54   0.59              0.9             35.43      No      No

Impression

IUP at 36+3 weeks with IUGR
Amniotic fluid normal with AFI 8.4cm and MVP 3.7cm
Umbilical artery dopplers normal with no absent or reversed
end diastolic flow
Recommendations

No evidence of fetal compromise today. however due to low
growth would recommend delivery as soon after 37 weeks as
is practical

## 2019-04-17 DIAGNOSIS — Z20828 Contact with and (suspected) exposure to other viral communicable diseases: Secondary | ICD-10-CM | POA: Diagnosis not present

## 2019-04-17 DIAGNOSIS — Z7189 Other specified counseling: Secondary | ICD-10-CM | POA: Diagnosis not present

## 2019-04-17 DIAGNOSIS — R05 Cough: Secondary | ICD-10-CM | POA: Diagnosis not present

## 2020-01-30 DIAGNOSIS — Z01419 Encounter for gynecological examination (general) (routine) without abnormal findings: Secondary | ICD-10-CM | POA: Diagnosis not present

## 2020-01-30 DIAGNOSIS — Z681 Body mass index (BMI) 19 or less, adult: Secondary | ICD-10-CM | POA: Diagnosis not present

## 2020-01-30 DIAGNOSIS — Z1231 Encounter for screening mammogram for malignant neoplasm of breast: Secondary | ICD-10-CM | POA: Diagnosis not present

## 2020-05-16 DIAGNOSIS — N390 Urinary tract infection, site not specified: Secondary | ICD-10-CM | POA: Diagnosis not present

## 2021-03-14 LAB — EXTERNAL GENERIC LAB PROCEDURE: COLOGUARD: NEGATIVE

## 2023-04-26 ENCOUNTER — Other Ambulatory Visit: Payer: Self-pay | Admitting: Obstetrics

## 2023-04-26 DIAGNOSIS — Z9189 Other specified personal risk factors, not elsewhere classified: Secondary | ICD-10-CM

## 2023-05-24 ENCOUNTER — Other Ambulatory Visit: Payer: BLUE CROSS/BLUE SHIELD

## 2023-06-17 ENCOUNTER — Ambulatory Visit
Admission: RE | Admit: 2023-06-17 | Discharge: 2023-06-17 | Disposition: A | Discharge status: Home / Self Care | Payer: No Typology Code available for payment source | Source: Ambulatory Visit | Attending: Obstetrics | Admitting: Obstetrics

## 2023-06-17 DIAGNOSIS — Z9189 Other specified personal risk factors, not elsewhere classified: Secondary | ICD-10-CM
